# Patient Record
Sex: Female | Born: 1950 | Race: White | Hispanic: No | Marital: Single | Smoking: Former smoker
Health system: Southern US, Community
[De-identification: ages and names within clinical notes are randomized; demographics above are authoritative.]

## PROBLEM LIST (undated history)

## (undated) DIAGNOSIS — F419 Anxiety disorder, unspecified: Secondary | ICD-10-CM

## (undated) DIAGNOSIS — Z87442 Personal history of urinary calculi: Secondary | ICD-10-CM

## (undated) DIAGNOSIS — E54 Ascorbic acid deficiency: Secondary | ICD-10-CM

## (undated) DIAGNOSIS — M810 Age-related osteoporosis without current pathological fracture: Secondary | ICD-10-CM

## (undated) DIAGNOSIS — G4700 Insomnia, unspecified: Secondary | ICD-10-CM

## (undated) DIAGNOSIS — E559 Vitamin D deficiency, unspecified: Secondary | ICD-10-CM

## (undated) DIAGNOSIS — C801 Malignant (primary) neoplasm, unspecified: Secondary | ICD-10-CM

## (undated) DIAGNOSIS — I2510 Atherosclerotic heart disease of native coronary artery without angina pectoris: Secondary | ICD-10-CM

## (undated) DIAGNOSIS — I10 Essential (primary) hypertension: Secondary | ICD-10-CM

## (undated) DIAGNOSIS — E785 Hyperlipidemia, unspecified: Secondary | ICD-10-CM

## (undated) HISTORY — PX: BACK SURGERY: SHX140

## (undated) HISTORY — DX: Age-related osteoporosis without current pathological fracture: M81.0

## (undated) HISTORY — PX: WISDOM TOOTH EXTRACTION: SHX21

## (undated) HISTORY — PX: COLONOSCOPY: SHX174

## (undated) HISTORY — PX: LITHOTRIPSY: SUR834

## (undated) HISTORY — PX: PELVIC LAPAROSCOPY: SHX162

## (undated) HISTORY — DX: Atherosclerotic heart disease of native coronary artery without angina pectoris: I25.10

## (undated) HISTORY — DX: Hyperlipidemia, unspecified: E78.5

## (undated) HISTORY — DX: Insomnia, unspecified: G47.00

## (undated) HISTORY — DX: Anxiety disorder, unspecified: F41.9

## (undated) HISTORY — DX: Vitamin D deficiency, unspecified: E55.9

## (undated) HISTORY — DX: Ascorbic acid deficiency: E54

## (undated) HISTORY — PX: EYE SURGERY: SHX253

---

## 1998-09-27 ENCOUNTER — Other Ambulatory Visit: Admission: RE | Admit: 1998-09-27 | Discharge: 1998-09-27 | Payer: Self-pay | Admitting: *Deleted

## 1999-11-13 ENCOUNTER — Other Ambulatory Visit: Admission: RE | Admit: 1999-11-13 | Discharge: 1999-11-13 | Payer: Self-pay | Admitting: Obstetrics & Gynecology

## 2000-11-17 ENCOUNTER — Other Ambulatory Visit: Admission: RE | Admit: 2000-11-17 | Discharge: 2000-11-17 | Payer: Self-pay | Admitting: Obstetrics & Gynecology

## 2002-05-04 ENCOUNTER — Other Ambulatory Visit: Admission: RE | Admit: 2002-05-04 | Discharge: 2002-05-04 | Payer: Self-pay | Admitting: Obstetrics & Gynecology

## 2009-12-25 ENCOUNTER — Encounter: Admission: RE | Admit: 2009-12-25 | Discharge: 2009-12-25 | Payer: Self-pay | Admitting: Internal Medicine

## 2010-01-07 ENCOUNTER — Ambulatory Visit (HOSPITAL_COMMUNITY): Admission: RE | Admit: 2010-01-07 | Discharge: 2010-01-07 | Payer: Self-pay | Admitting: Urology

## 2010-04-08 ENCOUNTER — Ambulatory Visit (HOSPITAL_COMMUNITY): Admission: RE | Admit: 2010-04-08 | Discharge: 2010-04-08 | Payer: Self-pay | Admitting: Urology

## 2010-05-26 ENCOUNTER — Encounter: Admission: RE | Admit: 2010-05-26 | Discharge: 2010-05-26 | Payer: Self-pay | Admitting: Obstetrics & Gynecology

## 2010-06-14 ENCOUNTER — Ambulatory Visit (HOSPITAL_COMMUNITY)
Admission: RE | Admit: 2010-06-14 | Discharge: 2010-06-14 | Payer: Self-pay | Source: Home / Self Care | Admitting: Obstetrics & Gynecology

## 2010-07-09 ENCOUNTER — Ambulatory Visit (HOSPITAL_COMMUNITY)
Admission: RE | Admit: 2010-07-09 | Discharge: 2010-07-09 | Payer: Self-pay | Source: Home / Self Care | Attending: Urology | Admitting: Urology

## 2010-08-27 ENCOUNTER — Other Ambulatory Visit: Payer: Self-pay | Admitting: Obstetrics & Gynecology

## 2010-09-23 LAB — SURGICAL PCR SCREEN
MRSA, PCR: NEGATIVE
Staphylococcus aureus: NEGATIVE

## 2010-09-23 LAB — CBC
Hemoglobin: 11.3 g/dL — ABNORMAL LOW (ref 12.0–15.0)
MCHC: 33 g/dL (ref 30.0–36.0)
RDW: 13 % (ref 11.5–15.5)
WBC: 5.4 10*3/uL (ref 4.0–10.5)

## 2010-09-24 LAB — COMPREHENSIVE METABOLIC PANEL
ALT: 31 U/L (ref 0–35)
BUN: 13 mg/dL (ref 6–23)
CO2: 26 mEq/L (ref 19–32)
Calcium: 9.2 mg/dL (ref 8.4–10.5)
Creatinine, Ser: 0.76 mg/dL (ref 0.4–1.2)
GFR calc non Af Amer: >60 mL/min (ref >60)
Glucose, Bld: 82 mg/dL (ref 70–99)

## 2010-09-24 LAB — CBC
HCT: 33.9 % — ABNORMAL LOW (ref 36.0–46.0)
Hemoglobin: 11.5 g/dL — ABNORMAL LOW (ref 12.0–15.0)
MCH: 29.8 pg (ref 26.0–34.0)
MCHC: 34 g/dL (ref 30.0–36.0)
MCV: 87.6 fL (ref 78.0–100.0)

## 2010-09-24 LAB — TYPE AND SCREEN
ABO/RH(D): O POS
Antibody Screen: NEGATIVE

## 2010-09-26 LAB — SURGICAL PCR SCREEN: Staphylococcus aureus: NEGATIVE

## 2010-11-06 ENCOUNTER — Other Ambulatory Visit (HOSPITAL_COMMUNITY): Payer: Self-pay | Admitting: Urology

## 2010-11-06 DIAGNOSIS — N1330 Unspecified hydronephrosis: Secondary | ICD-10-CM

## 2010-11-26 ENCOUNTER — Encounter (HOSPITAL_COMMUNITY)
Admission: RE | Admit: 2010-11-26 | Discharge: 2010-11-26 | Disposition: A | Discharge status: Home / Self Care | Payer: BC Managed Care – PPO | Source: Ambulatory Visit | Attending: Urology | Admitting: Urology

## 2010-11-26 DIAGNOSIS — N1330 Unspecified hydronephrosis: Secondary | ICD-10-CM | POA: Insufficient documentation

## 2010-11-26 DIAGNOSIS — N200 Calculus of kidney: Secondary | ICD-10-CM | POA: Insufficient documentation

## 2010-11-26 MED ORDER — FUROSEMIDE 10 MG/ML IJ SOLN: 30.0000 mg | Freq: Once | INTRAMUSCULAR | Status: DC

## 2010-11-26 MED ORDER — TECHNETIUM TC 99M MERTIATIDE
15.1000 | Freq: Once | INTRAVENOUS | Status: AC | PRN
  Administered 2010-11-26: 15.1 via INTRAVENOUS

## 2011-11-24 IMAGING — CR DG ABDOMEN 1V
1 series · 1 of 1 positions shown · non-contrast
Comparison: CT 12/25/2009.

CLINICAL DATA: 59-year-old female pre lithotripsy.  Left urologic
stone.

ABDOMEN - 1 VIEW

[t abdomen supine]
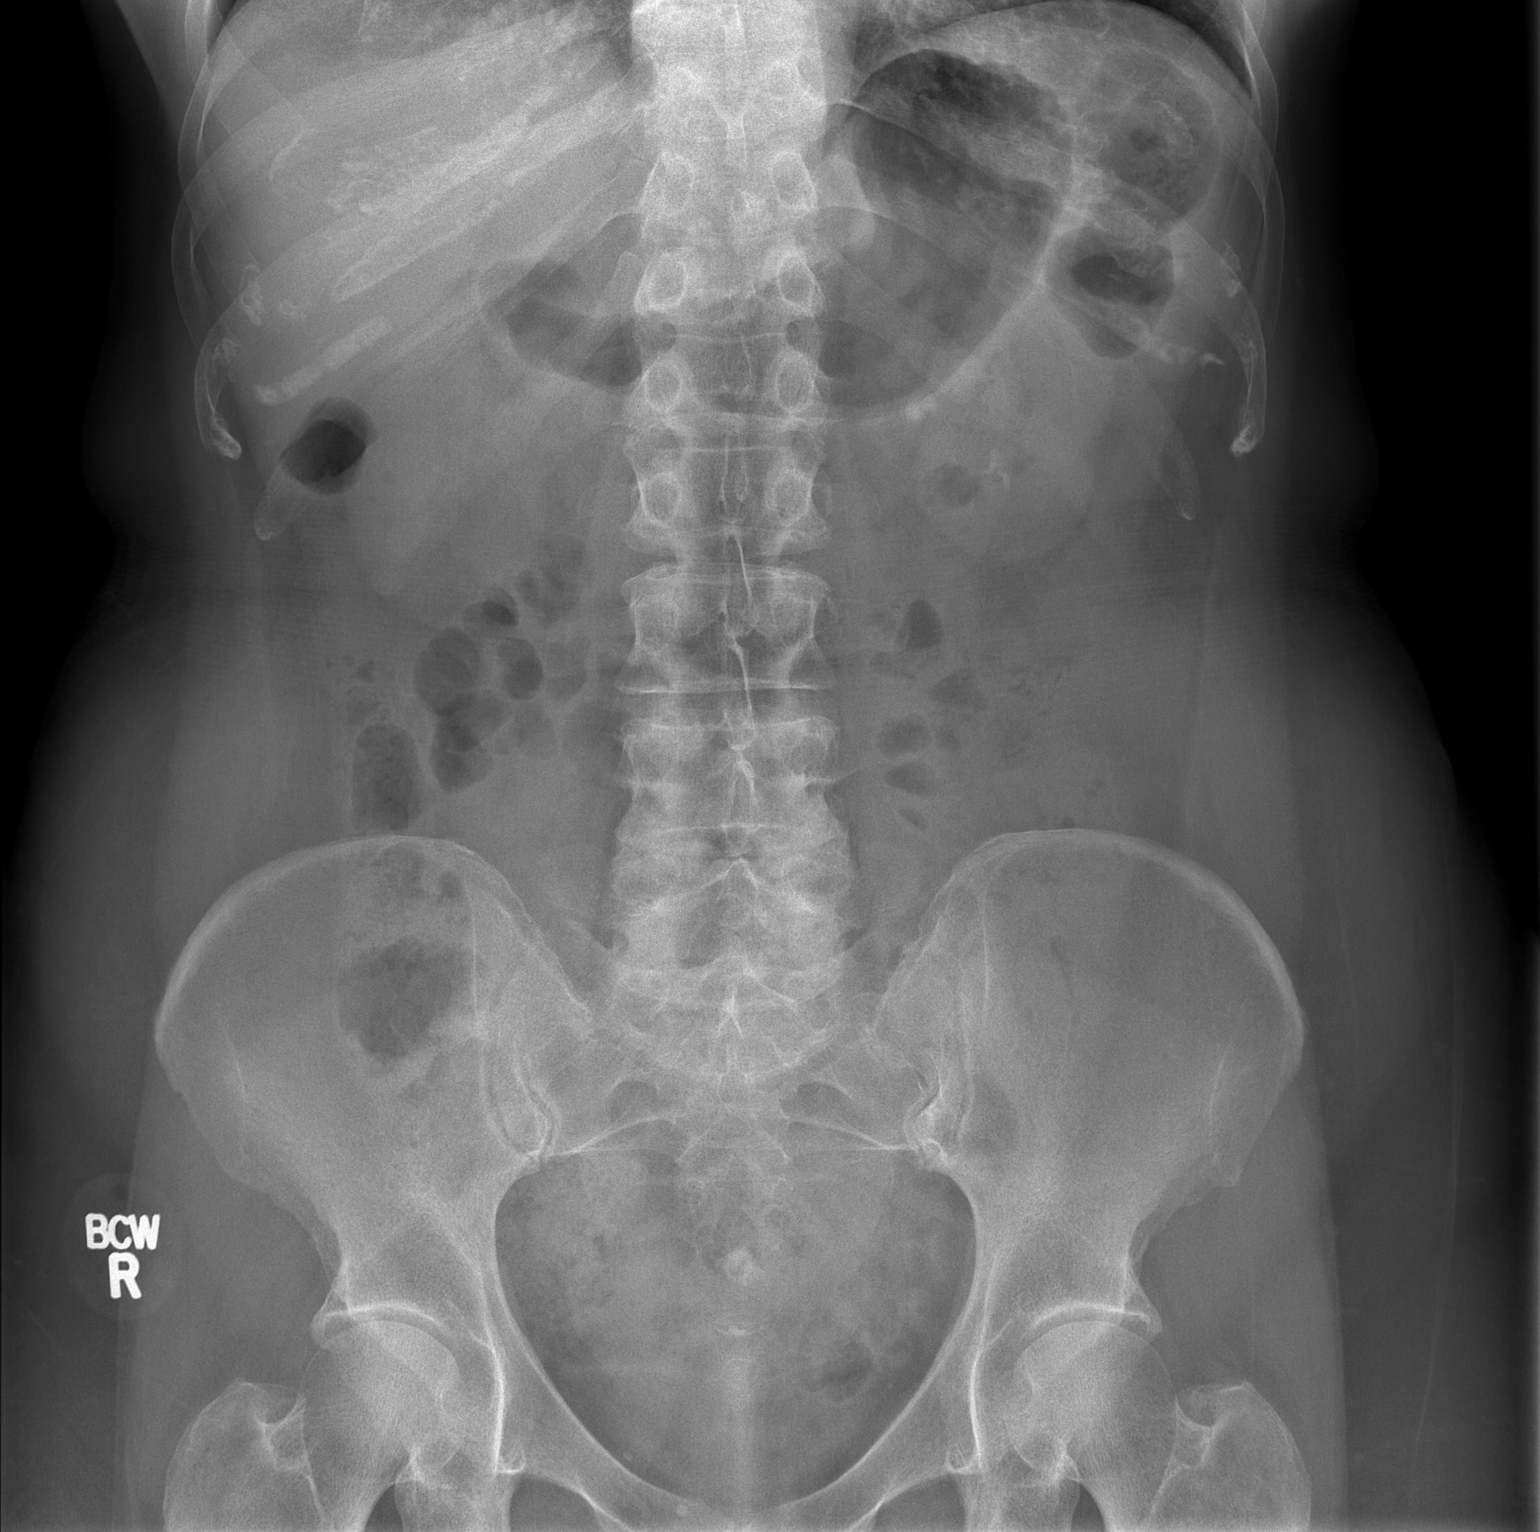

[1 of 1 positions shown; findings below may reference images not displayed]

FINDINGS: Nephrolithiasis re-identified with an 8 x 5 mm left renal
pelvis calculus and multiple curvilinear left lower pole calculi.
No definite left ureteral calculus. Nonobstructed bowel gas
pattern. Other abdominal visceral contours are within normal
limits.  No right nephrolithiasis. No acute osseous abnormality
identified.
IMPRESSION: Left nephrolithiasis appears stable including an 8 x 4 mm calculus
at the left renal pelvis.

## 2013-06-28 ENCOUNTER — Other Ambulatory Visit: Payer: Self-pay | Admitting: Internal Medicine

## 2013-06-28 DIAGNOSIS — R109 Unspecified abdominal pain: Secondary | ICD-10-CM

## 2013-07-01 ENCOUNTER — Ambulatory Visit
Admission: RE | Admit: 2013-07-01 | Discharge: 2013-07-01 | Disposition: A | Discharge status: Home / Self Care | Payer: BC Managed Care – PPO | Source: Ambulatory Visit | Attending: Internal Medicine | Admitting: Internal Medicine

## 2013-07-01 DIAGNOSIS — R109 Unspecified abdominal pain: Secondary | ICD-10-CM

## 2013-12-29 ENCOUNTER — Other Ambulatory Visit: Payer: Self-pay | Admitting: Gastroenterology

## 2013-12-29 DIAGNOSIS — R109 Unspecified abdominal pain: Secondary | ICD-10-CM

## 2014-01-16 ENCOUNTER — Encounter (HOSPITAL_COMMUNITY): Payer: Self-pay

## 2014-01-16 ENCOUNTER — Encounter (HOSPITAL_COMMUNITY)
Admission: RE | Admit: 2014-01-16 | Discharge: 2014-01-16 | Disposition: A | Discharge status: Home / Self Care | Payer: BC Managed Care – PPO | Source: Ambulatory Visit | Attending: Gastroenterology | Admitting: Gastroenterology

## 2014-01-16 ENCOUNTER — Ambulatory Visit (HOSPITAL_COMMUNITY)
Admission: RE | Admit: 2014-01-16 | Discharge: 2014-01-16 | Disposition: A | Discharge status: Home / Self Care | Payer: BC Managed Care – PPO | Source: Ambulatory Visit | Attending: Gastroenterology | Admitting: Gastroenterology

## 2014-01-16 DIAGNOSIS — R109 Unspecified abdominal pain: Secondary | ICD-10-CM | POA: Insufficient documentation

## 2014-01-16 DIAGNOSIS — N281 Cyst of kidney, acquired: Secondary | ICD-10-CM | POA: Insufficient documentation

## 2014-01-16 DIAGNOSIS — K7689 Other specified diseases of liver: Secondary | ICD-10-CM | POA: Insufficient documentation

## 2014-01-16 MED ORDER — TECHNETIUM TC 99M MEBROFENIN IV KIT
5.5000 | PACK | Freq: Once | INTRAVENOUS | Status: AC | PRN
  Administered 2014-01-16: 5.5 via INTRAVENOUS

## 2017-01-28 ENCOUNTER — Other Ambulatory Visit: Payer: Self-pay | Admitting: Internal Medicine

## 2017-01-28 DIAGNOSIS — M545 Low back pain, unspecified: Secondary | ICD-10-CM

## 2017-01-28 DIAGNOSIS — M5136 Other intervertebral disc degeneration, lumbar region: Secondary | ICD-10-CM

## 2017-02-16 ENCOUNTER — Ambulatory Visit
Admission: RE | Admit: 2017-02-16 | Discharge: 2017-02-16 | Disposition: A | Discharge status: Home / Self Care | Payer: Medicare Other | Source: Ambulatory Visit | Attending: Internal Medicine | Admitting: Internal Medicine

## 2017-02-16 DIAGNOSIS — M545 Low back pain, unspecified: Secondary | ICD-10-CM

## 2017-02-16 DIAGNOSIS — M5136 Other intervertebral disc degeneration, lumbar region: Secondary | ICD-10-CM

## 2017-10-21 ENCOUNTER — Other Ambulatory Visit: Payer: Self-pay | Admitting: Internal Medicine

## 2017-10-21 DIAGNOSIS — E785 Hyperlipidemia, unspecified: Secondary | ICD-10-CM

## 2017-10-21 DIAGNOSIS — I10 Essential (primary) hypertension: Secondary | ICD-10-CM

## 2017-11-12 ENCOUNTER — Ambulatory Visit
Admission: RE | Admit: 2017-11-12 | Discharge: 2017-11-12 | Disposition: A | Discharge status: Home / Self Care | Payer: Medicare Other | Source: Ambulatory Visit | Attending: Internal Medicine | Admitting: Internal Medicine

## 2017-11-12 DIAGNOSIS — I10 Essential (primary) hypertension: Secondary | ICD-10-CM

## 2017-11-12 DIAGNOSIS — E785 Hyperlipidemia, unspecified: Secondary | ICD-10-CM

## 2019-08-11 ENCOUNTER — Ambulatory Visit: Payer: No Typology Code available for payment source

## 2019-08-19 ENCOUNTER — Ambulatory Visit: Payer: No Typology Code available for payment source

## 2019-09-01 ENCOUNTER — Ambulatory Visit: Payer: No Typology Code available for payment source

## 2020-06-22 ENCOUNTER — Other Ambulatory Visit: Payer: Self-pay | Admitting: Internal Medicine

## 2020-06-22 DIAGNOSIS — M810 Age-related osteoporosis without current pathological fracture: Secondary | ICD-10-CM

## 2020-09-12 ENCOUNTER — Ambulatory Visit: Payer: Self-pay | Admitting: Obstetrics & Gynecology

## 2020-09-18 ENCOUNTER — Ambulatory Visit: Payer: Self-pay | Admitting: Obstetrics & Gynecology

## 2020-10-01 ENCOUNTER — Other Ambulatory Visit: Payer: Self-pay

## 2020-10-01 ENCOUNTER — Ambulatory Visit
Admission: RE | Admit: 2020-10-01 | Discharge: 2020-10-01 | Disposition: A | Discharge status: Home / Self Care | Payer: Medicare Other | Source: Ambulatory Visit | Attending: Internal Medicine | Admitting: Internal Medicine

## 2020-10-01 DIAGNOSIS — M810 Age-related osteoporosis without current pathological fracture: Secondary | ICD-10-CM

## 2020-10-04 ENCOUNTER — Encounter: Payer: Self-pay | Admitting: Obstetrics & Gynecology

## 2020-10-04 ENCOUNTER — Other Ambulatory Visit: Payer: Self-pay

## 2020-10-04 ENCOUNTER — Ambulatory Visit: Payer: Medicare Other | Admitting: Obstetrics & Gynecology

## 2020-10-04 ENCOUNTER — Other Ambulatory Visit: Payer: Self-pay | Admitting: Internal Medicine

## 2020-10-04 VITALS — BP 150/90 | Ht 63.0 in | Wt 144.0 lb

## 2020-10-04 DIAGNOSIS — M810 Age-related osteoporosis without current pathological fracture: Secondary | ICD-10-CM

## 2020-10-04 DIAGNOSIS — Z01419 Encounter for gynecological examination (general) (routine) without abnormal findings: Secondary | ICD-10-CM

## 2020-10-04 DIAGNOSIS — Z780 Asymptomatic menopausal state: Secondary | ICD-10-CM

## 2020-10-04 NOTE — Progress Notes (Signed)
Jodi Dawson Feb 17, 1951 [id number]   History:    70 y.o. G0  RP:  New (>3 years) patient presenting for annual gyn exam   HPI: Postmenopause, well on no HRT.  No PMB.  H/O Unilateral Oophorectomy.  No pelvic pain.  Urine/BMs normal.  Breasts normal.  Screening mammo 08/2020 Negative.  [facility] 2016.  BD 09/2020 T-Score -2.8 at the Left Total Femur, managed by Dr Joylene Draft.  Health labs with Dr Joylene Draft.  BMI 25.51.  Good fitness.    Past medical history,surgical history, family history and social history were all reviewed and documented in the EPIC chart.  Gynecologic History No LMP recorded. Patient is postmenopausal.  Obstetric History OB History  Gravida Para Term Preterm AB Living  0 0 0 0 0 0  SAB IAB Ectopic Multiple Live Births  0 0 0 0 0     ROS: A ROS was performed and pertinent positives and negatives are included in the history.  GENERAL: No fevers or chills. HEENT: No change in vision, no earache, sore throat or sinus congestion. NECK: No pain or stiffness. CARDIOVASCULAR: No chest pain or pressure. No palpitations. PULMONARY: No shortness of breath, cough or wheeze. GASTROINTESTINAL: No abdominal pain, nausea, vomiting or diarrhea, melena or bright red blood per rectum. GENITOURINARY: No urinary frequency, urgency, hesitancy or dysuria. MUSCULOSKELETAL: No joint or muscle pain, no back pain, no recent trauma. DERMATOLOGIC: No rash, no itching, no lesions. ENDOCRINE: No polyuria, polydipsia, no heat or cold intolerance. No recent change in weight. HEMATOLOGICAL: No anemia or easy bruising or bleeding. NEUROLOGIC: No headache, seizures, numbness, tingling or weakness. PSYCHIATRIC: No depression, no loss of interest in normal activity or change in sleep pattern.     Exam:   BP (!) 150/90   Ht 5\' 3"  (1.6 m)   Wt 144 lb (65.3 kg)   BMI 25.51 kg/m   Body mass index is 25.51 kg/m.  General appearance : Well developed well nourished female. No acute distress HEENT: Eyes: no  retinal hemorrhage or exudates,  Neck supple, trachea midline, no carotid bruits, no thyroidmegaly Lungs: Clear to auscultation, no rhonchi or wheezes, or rib retractions  Heart: Regular rate and rhythm, no murmurs or gallops Breast:Examined in sitting and supine position were symmetrical in appearance, no palpable masses or tenderness,  no skin retraction, no nipple inversion, no nipple discharge, no skin discoloration, no axillary or supraclavicular lymphadenopathy Abdomen: no palpable masses or tenderness, no rebound or guarding Extremities: no edema or skin discoloration or tenderness  Pelvic: Vulva: Normal             Vagina: No gross lesions or discharge  Cervix: No gross lesions or discharge.  Pap reflex done.  Uterus  AV, normal size, shape and consistency, non-tender and mobile  Adnexa  Without masses or tenderness  Anus: Normal   Assessment/Plan:  70 y.o. female for annual exam   1. Encounter for routine gynecological examination with Papanicolaou smear of cervix Normal gynecologic exam in menopause.  Pap reflex done.  Breast exam normal.  Screening mammogram February 2022 was negative.  Colonoscopy 2016.  Health labs with Dr. Joylene Draft.  Body mass index 25.51.  Continue with fitness and healthy nutrition.  2. Postmenopause Well on no hormone replacement therapy.  No postmenopausal bleeding.  3. Age-related osteoporosis without current pathological fracture Osteoporosis with a T score of -2.8 at the left total femur on bone density March 2022.  Decided not to start on bone medication.  Taking  a bone supplementation including vitamin D and calcium.  Weightbearing physical activities to continue.  Dr. Milagros Loll managing.  Other orders - telmisartan (MICARDIS) 40 MG tablet; Take 40 mg by mouth daily. - simvastatin (ZOCOR) 10 MG tablet; Take 10 mg by mouth daily. - cholecalciferol (VITAMIN D3) 25 MCG (1000 UNIT) tablet; Take 1,000 Units by mouth daily.  Princess Bruins MD, 11:36 AM  10/04/2020

## 2020-10-08 LAB — PAP IG W/ RFLX HPV ASCU

## 2020-10-10 ENCOUNTER — Encounter: Payer: Self-pay | Admitting: Obstetrics & Gynecology

## 2020-11-06 ENCOUNTER — Encounter: Payer: Self-pay | Admitting: Obstetrics & Gynecology

## 2021-10-02 ENCOUNTER — Other Ambulatory Visit: Payer: Self-pay | Admitting: Urology

## 2021-10-07 NOTE — Progress Notes (Signed)
Pre op phone call completed.  Pt aware she is NPO after midnight.  Pt to arrive at 645.  ?

## 2021-10-07 NOTE — Progress Notes (Signed)
Left message to call us back

## 2021-10-08 NOTE — H&P (Signed)
I have kidney stones.  ?HPI: Jodi Dawson is a 71 year-old female established patient who is here for renal calculi. ? ?The problem is on the left side. This is not her first kidney stone. She has had 4 stones prior to getting this one.  ? ?She has had eswl, ureteral stent, and ureteroscopy for treatment of her stones in the past.  ? ?10/02/21: Jodi Dawson returns today for her history of stones. Last Friday she had some left flank pain and for a week prior she had some LLQ discomfort and bloating with alternating diarrhea and constipation. The pain on Friday got progressively worse and lasted for 5 hours and took tramadol. She did well from Saturday until today. It is back today though. He has no hematuria or inceased voiding symptoms. She had nausea all week as well that was mild. She is waiting to schedule with [facility]. KUB today shows that an 14x76m stone that was in the LLP is now in the UPJ. There is a smaller, 564mLLP stone. She had ESWL and ureteroscopy with stenting x 2 in 2011 for a stone and a left mid ureteral stricture.  ? ?04/03/21: [name] today in f/u for her history of stones. she has had some severe pain for about an hour on the right last week. She had N/V with it as well. She had no hematuria. flank pain or hematuria. KUB today shows slight growth in the LLP stones. I don't see obvious right ureteral or renal stones. She has no associated signs or symptoms. Her prior stones were calcium oxalate. She had multiple procedures for a CaOx stone in 2011. She had ESWL and then ureteroscopy x 2 and require dilation of a midureteral stricture and stenting and didn't like the stent.  ? ?She had a prior 24hr urine. She had a urine volume of 81048mith a low pH and low magnesium. She has increased her fluid intake but only to about 50oz daily.  ? ?  ?ALLERGIES: No Allergies ?  ? ?MEDICATIONS: Simvastatin  ?Telmisartan  ?Vitamin D3 50 mcg (2,000 unit) capsule  ?  ? ?GU PSH: Cysto Dilate Ureteral Stricture -  2011, 2011 ?Cysto Uretero Lithotripsy - 2011 ?Cystoscopy Insert Stent - 2011, 2011, 2011 ?Cystoscopy Ureteroscopy - 2011 ?ESWL - 2011 ?Ureteroscopic stone removal - 2011 ? ?  ?   ?PSH Notes: Cystourethroscopy With Treatment Of Ureteral Stricture, Cystoscopy With Insertion Of Ureteral Stent Left, Cystoscopy With Ureteroscopy Left, Cystoscopy With Ureteroscopy With Removal Of Calculus, Cystourethroscopy With Treatment Of Ureteral Stricture, Cystoscopy With Insertion Of Ureteral Stent Left, Cystoscopy With Insertion Of Ureteral Stent Left, Cystoscopy With Ureteroscopy With Lithotripsy, Lithotripsy, Back Surgery  ? ?NON-GU PSH: No Non-GU PSH   ? ?GU PMH: Renal calculus, She has slight further enlargement of the left lower pole stone and had right flank pain about 2 weeks ago but that has resolved, the urine is clear and I don't see an obvious stone on KUB. If the pain recurs, I will get a CT, otherwise she will return in 6 months with a KUB. She has had a bad experience with ESWL and ureteroscopy and doesn't want a stent, so if the LLP stone needs treatment, we will need to consider a PCNL. I also discussed adding a thiazide but she is not interested at this time. - 04/03/2021, She has at most minimal growth in the LLP stones. I will have her return in a year with a KUB. , - 11/28/2019 (Stable), She will continue  to work on hydration and will return in a year with a KUB. , - 2020, Nephrolithiasis, - 2015 ?Ureteral calculus, I believe she recently passed a left ureteral stone. - 2019, Ureteral stone, - 2015, Ureteral Stone, - 2014 ?Ureteral stricture, Ureteral stricture - 2015 ?Gross hematuria, Gross hematuria - 2014 ?Hydronephrosis Unspec, Hydronephrosis - 2014 ?Urinary incontinence, Unspec, Urinary incontinence - 2014 ?Urinary Tract Inf, Unspec site, Pyuria - 2014, Urinary tract infection, - 2014 ?  ? ?NON-GU PMH: Encounter for general adult medical examination without abnormal findings, Encounter for preventive health  examination - 2015 ?Arthritis ?Hypercholesterolemia ?Hypertension ?  ? ?FAMILY HISTORY: Alzheimer's Disease - Mother ?Diabetes - Brother ?Heart Disease - Father ?nephrolithiasis - Mother ?Stroke Syndrome - Brother  ? ?SOCIAL HISTORY: Marital Status: Divorced ?Current Smoking Status: Patient does not smoke anymore. Has not smoked since 02/12/1984. Smoked for 16 years. Smoked 1 pack per day.  ? ?Tobacco Use Assessment Completed: Used Tobacco in last 30 days? ?Drinks 1 drink per day.  ?Drinks 2 caffeinated drinks per day. ?  ?  Notes: Former smoker, Tobacco Use, Marital History - Divorced, Occupation:, Alcohol Use  ? ?REVIEW OF SYSTEMS:    ?GU Review Female:   Patient reports get up at night to urinate. Patient denies frequent urination, hard to postpone urination, burning /pain with urination, leakage of urine, stream starts and stops, trouble starting your stream, have to strain to urinate, and being pregnant.  ?Gastrointestinal (Upper):   Patient denies nausea, vomiting, and indigestion/ heartburn.  ?Gastrointestinal (Lower):   Patient denies diarrhea and constipation.  ?Constitutional:   Patient denies fever, night sweats, weight loss, and fatigue.  ?Skin:   Patient denies skin rash/ lesion and itching.  ?Eyes:   Patient denies blurred vision and double vision.  ?Ears/ Nose/ Throat:   Patient denies sore throat and sinus problems.  ?Hematologic/Lymphatic:   Patient denies swollen glands and easy bruising.  ?Cardiovascular:   Patient denies leg swelling and chest pains.  ?Respiratory:   Patient denies cough and shortness of breath.  ?Endocrine:   Patient denies excessive thirst.  ?Musculoskeletal:   Patient denies back pain and joint pain.  ?Neurological:   Patient denies headaches and dizziness.  ?Psychologic:   Patient denies depression and anxiety.  ? ?VITAL SIGNS:    ?  10/02/2021 10:44 AM  ?Weight 143 lb / 64.86 kg  ?Height 63.5 in / 161.29 cm  ?BP 190/90 mmHg  ?Heart Rate 82 /min  ?Temperature 98.9 F / 37.1 C   ?BMI 24.9 kg/m?  ? ?MULTI-SYSTEM PHYSICAL EXAMINATION:    ?Constitutional: Well-nourished. No physical deformities. Normally developed. Good grooming.  ?Respiratory: Normal breath sounds. No labored breathing, no use of accessory muscles.   ?Cardiovascular: Regular rate and rhythm. No murmur, no gallop.   ?Gastrointestinal: No mass, no tenderness, no rigidity, non obese abdomen.   ? ?  ?Complexity of Data:  ?Records Review:   Previous Doctor Records, Previous Patient Records  ?Urine Test Review:   Urinalysis  ?X-Ray Review: KUB: Reviewed Films. Discussed With Patient.  ?C.T. Stone Protocol: Reviewed Films.  ?  ? ?PROCEDURES:    ?     KUB - 53299  ?A single view of the abdomen is obtained. 14x19m LUPJ stone and 533mLLP stone. NO bone, gas or soft tissue abnormalities.   ?  ?  ?. Patient confirmed No Neulasta OnPro Device.  ?  ? ? ?     Urinalysis ?Dipstick Dipstick Cont'd  ?Color: Yellow Bilirubin: Neg mg/dL  ?  Appearance: Clear Ketones: Trace mg/dL  ?Specific Gravity: 1.020 Blood: Neg ery/uL  ?pH: <=5.0 Protein: Trace mg/dL  ?Glucose: Neg mg/dL Urobilinogen: 0.2 mg/dL  ?  Nitrites: Neg  ?  Leukocyte Esterase: Neg leu/uL  ? ? ?ASSESSMENT:  ?    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 She has mild pain. I discussed options for therapy including ESWL, ureteroscopy and PCNL. She didn't like the stent, but is getting married in 6 weeks and doesn't want to risk the PCNL. After reviewing the options, we will get her set up for ESWL with the understanding that if the stone doesn't fragment readily or the fragments won't pass, we will proceed with ureteroscopy sooner than later to try to get her clear before her wedding.  ? ? ?I reviewed the risks of ESWL including bleeding, infection, injury to the kidney or adjacent structures, failure to fragment the stone, need for ancillary  procedures, thrombotic events, cardiac arrhythmias and sedation complications.  ? ?I have reviewed the risks of ureteroscopy including bleeding, infection, ureteral injury, need for a stent or secondary procedur

## 2021-10-10 ENCOUNTER — Encounter (HOSPITAL_BASED_OUTPATIENT_CLINIC_OR_DEPARTMENT_OTHER)
Admission: RE | Disposition: A | Discharge status: Home / Self Care | Payer: Self-pay | Source: Ambulatory Visit | Attending: Urology

## 2021-10-10 ENCOUNTER — Ambulatory Visit (HOSPITAL_BASED_OUTPATIENT_CLINIC_OR_DEPARTMENT_OTHER)
Admission: RE | Admit: 2021-10-10 | Discharge: 2021-10-10 | Disposition: A | Discharge status: Home / Self Care | Payer: Medicare Other | Source: Ambulatory Visit | Attending: Urology | Admitting: Urology

## 2021-10-10 ENCOUNTER — Ambulatory Visit (HOSPITAL_COMMUNITY): Payer: Medicare Other

## 2021-10-10 ENCOUNTER — Encounter (HOSPITAL_BASED_OUTPATIENT_CLINIC_OR_DEPARTMENT_OTHER): Payer: Self-pay | Admitting: Urology

## 2021-10-10 ENCOUNTER — Other Ambulatory Visit: Payer: Self-pay

## 2021-10-10 DIAGNOSIS — Z87891 Personal history of nicotine dependence: Secondary | ICD-10-CM | POA: Diagnosis not present

## 2021-10-10 DIAGNOSIS — N200 Calculus of kidney: Secondary | ICD-10-CM | POA: Diagnosis present

## 2021-10-10 DIAGNOSIS — N202 Calculus of kidney with calculus of ureter: Secondary | ICD-10-CM | POA: Insufficient documentation

## 2021-10-10 HISTORY — PX: EXTRACORPOREAL SHOCK WAVE LITHOTRIPSY: SHX1557

## 2021-10-10 HISTORY — DX: Essential (primary) hypertension: I10

## 2021-10-10 SURGERY — LITHOTRIPSY, ESWL
Anesthesia: LOCAL | Laterality: Left

## 2021-10-10 MED ORDER — CIPROFLOXACIN HCL 500 MG PO TABS
500.0000 mg | ORAL_TABLET | ORAL | Status: AC
  Administered 2021-10-10: 500 mg via ORAL

## 2021-10-10 MED ORDER — DIPHENHYDRAMINE HCL 25 MG PO CAPS
ORAL_CAPSULE | ORAL | Status: AC
  Filled 2021-10-10: qty 1

## 2021-10-10 MED ORDER — DIAZEPAM 5 MG PO TABS: 10.0000 mg | ORAL_TABLET | ORAL | Status: DC

## 2021-10-10 MED ORDER — DIAZEPAM 5 MG PO TABS
ORAL_TABLET | ORAL | Status: AC
  Filled 2021-10-10: qty 2

## 2021-10-10 MED ORDER — SODIUM CHLORIDE 0.9 % IV SOLN: INTRAVENOUS | Status: DC

## 2021-10-10 MED ORDER — CIPROFLOXACIN HCL 500 MG PO TABS
ORAL_TABLET | ORAL | Status: AC
  Filled 2021-10-10: qty 1

## 2021-10-10 MED ORDER — ONDANSETRON HCL 4 MG PO TABS: 4.0000 mg | ORAL_TABLET | Freq: Three times a day (TID) | ORAL | 1 refills | Status: DC | PRN

## 2021-10-10 MED ORDER — DIAZEPAM 5 MG PO TABS
5.0000 mg | ORAL_TABLET | Freq: Once | ORAL | Status: AC
  Administered 2021-10-10: 5 mg via ORAL

## 2021-10-10 MED ORDER — DIPHENHYDRAMINE HCL 25 MG PO CAPS
25.0000 mg | ORAL_CAPSULE | ORAL | Status: AC
  Administered 2021-10-10: 25 mg via ORAL

## 2021-10-10 NOTE — Progress Notes (Signed)
Patients vital signs assessed while in phase II. Blood pressure reading of 187/83 at 1012. Cuff was switched to opposite arm and read 184/81. Patient asymptomatic and stated that she just felt drowsy from the anesthetic. Patient stated that she has white coat syndrome. Patients blood pressure was 171/85 baseline in pre-op. Patient takes Telmisartan for her blood pressure and the last dose was taken on 10/09/2021 at 1930. Patients blood pressure was rechecked at 1041 and read 177/68. Dr. Matilde Sprang notified by phone of blood pressure readings in phase II, pre-op, and patients last dose and time of medication. RN was asked if patient was asymptomatic and she stated yes and was advised to tell patient to take blood pressure medication once she arrived home. Patient was given verbal instructions from Dr. Matilde Sprang and she verbalized understanding.  ?

## 2021-10-10 NOTE — Interval H&P Note (Signed)
History and Physical Interval Note: ? ?10/10/2021 ?8:15 AM ? ?Jodi Dawson  has presented today for surgery, with the diagnosis of LEFT URETERAL PELVIC JUNCTION STONE.  The various methods of treatment have been discussed with the patient and family. After consideration of risks, benefits and other options for treatment, the patient has consented to  Procedure(s): ?LEFT EXTRACORPOREAL SHOCK WAVE LITHOTRIPSY (ESWL) (Left) as a surgical intervention.  The patient's history has been reviewed, patient examined, no change in status, stable for surgery.  I have reviewed the patient's chart and labs.  Questions were answered to the patient's satisfaction.   ? ? ?Bradford Cazier A Carnel Stegman ? ? ?

## 2021-10-10 NOTE — Discharge Instructions (Addendum)
I have reviewed discharge instructions in detail with the patient. They will follow-up with me or their physician as scheduled. My nurse will also be calling the patients as per protocol.   

## 2021-10-11 ENCOUNTER — Encounter (HOSPITAL_BASED_OUTPATIENT_CLINIC_OR_DEPARTMENT_OTHER): Payer: Self-pay | Admitting: Urology

## 2021-10-14 ENCOUNTER — Other Ambulatory Visit: Payer: Self-pay | Admitting: Urology

## 2021-10-16 NOTE — Patient Instructions (Addendum)
DUE TO COVID-19 ONLY ONE VISITOR  (aged 72 and older)  IS ALLOWED TO COME WITH YOU AND STAY IN THE WAITING ROOM ONLY DURING PRE OP AND PROCEDURE.   ?**NO VISITORS ARE ALLOWED IN THE SHORT STAY AREA OR RECOVERY ROOM!!** ? ?IF YOU WILL BE ADMITTED INTO THE HOSPITAL YOU ARE ALLOWED ONLY TWO SUPPORT PEOPLE DURING VISITATION HOURS ONLY (7 AM -8PM)   ?The support person(s) must pass our screening, gel in and out, and wear a mask at all times, including in the patient?s room. ?Patients must also wear a mask when staff or their support person are in the room. ?Visitors GUEST BADGE MUST BE WORN VISIBLY  ?One adult visitor may remain with you overnight and MUST be in the room by 8 P.M. ?  ? ? Your procedure is scheduled on: 10/25/21 ? ? Report to [facility] Main Entrance ? ?  Report to admitting at : 8:45 AM ? ? Call this number if you have problems the morning of surgery [phone] ? ? Do not eat food :After Midnight. ? ? After Midnight you may have the following liquids until : 8:00 AM DAY OF SURGERY ? ?Water ?Black Coffee (sugar ok, NO MILK/CREAM OR CREAMERS)  ?Tea (sugar ok, NO MILK/CREAM OR CREAMERS) regular and decaf                             ?Plain Jell-O (NO RED)                                           ?Fruit ices (not with fruit pulp, NO RED)                                     ?Popsicles (NO RED)                                                                  ?Juice: apple, WHITE grape, WHITE cranberry ?Sports drinks like Gatorade (NO RED) ?Clear broth(vegetable,chicken,beef)  ?  ?Oral Hygiene is also important to reduce your risk of infection.                                    ?Remember - BRUSH YOUR TEETH THE MORNING OF SURGERY WITH YOUR REGULAR TOOTHPASTE ? ? Do NOT smoke after Midnight ? ? Take these medicines the morning of surgery with A SIP OF WATER: N/A ? ?DO NOT TAKE ANY ORAL DIABETIC MEDICATIONS DAY OF YOUR SURGERY ? ?Bring CPAP mask and tubing day of surgery. ?                  ?            You may not have any metal on your body including hair pins, jewelry, and body piercing ? ?           Do not wear make-up, lotions, powders, perfumes/cologne, or deodorant ? ?Do not wear nail polish  including gel and S&S, artificial/acrylic nails, or any other type of covering on natural nails including finger and toenails. If you have artificial nails, gel coating, etc. that needs to be removed by a nail salon please have this removed prior to surgery or surgery may need to be canceled/ delayed if the surgeon/ anesthesia feels like they are unable to be safely monitored.  ? ?Do not shave  48 hours prior to surgery.  ? ? Do not bring valuables to the hospital. [facility] NOT ?            RESPONSIBLE   FOR VALUABLES. ? ? Contacts, dentures or bridgework may not be worn into surgery. ? ? Bring small overnight bag day of surgery. ?  ? Patients discharged on the day of surgery will not be allowed to drive home.  Someone NEEDS to stay with you for the first 24 hours after anesthesia. ? ? Special Instructions: Bring a copy of your healthcare power of attorney and living will documents         the day of surgery if you haven't scanned them before. ? ?            Please read over the following fact sheets you were given: IF [place] [phone] ? ?   [facility] - Preparing for Surgery ?Before surgery, you can play an important role.  Because skin is not sterile, your skin needs to be as free of germs as possible.  You can reduce the number of germs on your skin by washing with CHG (chlorahexidine gluconate) soap before surgery.  CHG is an antiseptic cleaner which kills germs and bonds with the skin to continue killing germs even after washing. ?Please DO NOT use if you have an allergy to CHG or antibacterial soaps.  If your skin becomes reddened/irritated stop using the CHG and inform your nurse when you arrive at Short Stay. ?Do not shave (including legs and  underarms) for at least 48 hours prior to the first CHG shower.  You may shave your face/neck. ?Please follow these instructions carefully: ? 1.  Shower with CHG Soap the night before surgery and the  morning of Surgery. ? 2.  If you choose to wash your hair, wash your hair first as usual with your  normal  shampoo. ? 3.  After you shampoo, rinse your hair and body thoroughly to remove the  shampoo.                           4.  Use CHG as you would any other liquid soap.  You can apply chg directly  to the skin and wash  ?                     Gently with a scrungie or clean washcloth. ? 5.  Apply the CHG Soap to your body ONLY FROM THE NECK DOWN.   Do not use on face/ open      ?                     Wound or open sores. Avoid contact with eyes, ears mouth and genitals (private parts).  ?                     Production manager,  Genitals (private parts) with your normal soap. ?  6.  Wash thoroughly, paying special attention to the area where your surgery  will be performed. ? 7.  Thoroughly rinse your body with warm water from the neck down. ? 8.  DO NOT shower/wash with your normal soap after using and rinsing off  the CHG Soap. ?               9.  Pat yourself dry with a clean towel. ?           10.  Wear clean pajamas. ?           11.  Place clean sheets on your bed the night of your first shower and do not  sleep with pets. ?Day of Surgery : ?Do not apply any lotions/deodorants the morning of surgery.  Please wear clean clothes to the hospital/surgery center. ? ?FAILURE TO FOLLOW THESE INSTRUCTIONS MAY RESULT IN THE CANCELLATION OF YOUR SURGERY ?PATIENT SIGNATURE_________________________________ ? ?NURSE SIGNATURE__________________________________ ? ?________________________________________________________________________  ?

## 2021-10-17 ENCOUNTER — Other Ambulatory Visit: Payer: Self-pay

## 2021-10-17 ENCOUNTER — Encounter (HOSPITAL_COMMUNITY)
Admission: RE | Admit: 2021-10-17 | Discharge: 2021-10-17 | Disposition: A | Discharge status: Home / Self Care | Payer: Medicare Other | Source: Ambulatory Visit | Attending: Urology | Admitting: Urology

## 2021-10-17 ENCOUNTER — Encounter (HOSPITAL_COMMUNITY): Payer: Self-pay

## 2021-10-17 VITALS — BP 154/78 | HR 79 | Temp 98.0°F | Resp 14 | Ht 63.0 in | Wt 141.1 lb

## 2021-10-17 DIAGNOSIS — Z01818 Encounter for other preprocedural examination: Secondary | ICD-10-CM | POA: Diagnosis present

## 2021-10-17 DIAGNOSIS — I2510 Atherosclerotic heart disease of native coronary artery without angina pectoris: Secondary | ICD-10-CM | POA: Diagnosis not present

## 2021-10-17 HISTORY — DX: Malignant (primary) neoplasm, unspecified: C80.1

## 2021-10-17 HISTORY — DX: Personal history of urinary calculi: Z87.442

## 2021-10-17 LAB — CBC
HCT: 39.2 % (ref 36.0–46.0)
Hemoglobin: 12.8 g/dL (ref 12.0–15.0)
MCH: 29.4 pg (ref 26.0–34.0)
MCHC: 32.7 g/dL (ref 30.0–36.0)
MCV: 90.1 fL (ref 80.0–100.0)
Platelets: 165 10*3/uL (ref 150–400)
RBC: 4.35 MIL/uL (ref 3.87–5.11)
RDW: 12.4 % (ref 11.5–15.5)
WBC: 6.9 10*3/uL (ref 4.0–10.5)
nRBC: 0 % (ref 0.0–0.2)

## 2021-10-17 LAB — BASIC METABOLIC PANEL
Anion gap: 4 — ABNORMAL LOW (ref 5–15)
BUN: 25 mg/dL — ABNORMAL HIGH (ref 8–23)
CO2: 29 mmol/L (ref 22–32)
Calcium: 9.3 mg/dL (ref 8.9–10.3)
Chloride: 107 mmol/L (ref 98–111)
Creatinine, Ser: 0.74 mg/dL (ref 0.44–1.00)
GFR, Estimated: >60 mL/min (ref >60)
Glucose, Bld: 95 mg/dL (ref 70–99)
Potassium: 4.2 mmol/L (ref 3.5–5.1)
Sodium: 140 mmol/L (ref 135–145)

## 2021-10-17 NOTE — Progress Notes (Addendum)
For Short Stay: ?[facility] [place] appointment date: ?Date of COVID positive in last 90 days: ?COVID Vaccine: Pfizer x 4 ?Bowel Prep reminder: ? ? ?For Anesthesia: ?PCP - Dr. Crist Infante. ?Cardiologist -  ? ?Chest x-ray -  ?EKG -  ?Stress Test -  ?ECHO -  ?Cardiac Cath -  ?Pacemaker/ICD device last checked: ?Pacemaker orders received: ?Device Rep notified: ? ?Spinal Cord Stimulator: ? ?Sleep Study -  ?CPAP -  ? ?Fasting Blood Sugar -  ?Checks Blood Sugar _____ times a day ?Date and result of last Hgb A1c- ? ?Blood Thinner Instructions: ?Aspirin Instructions: ?Last Dose: ? ?Activity level: Can go up a flight of stairs and activities of daily living without stopping and without chest pain and/or shortness of breath ?  Able to exercise without chest pain and/or shortness of breath ?  Unable to go up a flight of stairs without chest pain and/or shortness of breath ?   ? ?Anesthesia review:  ? ?Patient denies shortness of breath, fever, cough and chest pain at PAT appointment ? ? ?Patient verbalized understanding of instructions that were given to them at the PAT appointment. Patient was also instructed that they will need to review over the PAT instructions again at home before surgery.  ?

## 2021-10-23 ENCOUNTER — Encounter: Payer: Self-pay | Admitting: Internal Medicine

## 2021-10-24 NOTE — H&P (Signed)
I have kidney stones.  ?HPI: Jodi Dawson is a 71 year-old female established patient who is here for renal calculi. ? ?10/21/2021: She underwent ESWL to treat a large left UPJ stone on 10/10/2021. Now scheduled for definitive left ureteroscopy later this week. She is only passed a couple of very small sand-like stone fragments. She has had minimal pain/discomfort post procedure. Denies new or worsening lower urinary tract symptoms including persistent dysuria or gross hematuria. No interval fevers or chills, nausea/vomiting. UA today is clear but KUB shows a collection of fragmented stone material remaining in the left lower pole. KUB performed last week showed fragmented stone material in the left lower pole as well as a Steinstrasse of small stone fragments along one of the transverse process of the lumbar vertebrae on the left side.  ? ?10/02/21: Jodi Dawson returns today for her history of stones. Last Friday she had some left flank pain and for a week prior she had some LLQ discomfort and bloating with alternating diarrhea and constipation. The pain on Friday got progressively worse and lasted for 5 hours and took tramadol. She did well from Saturday until today. It is back today though. He has no hematuria or inceased voiding symptoms. She had nausea all week as well that was mild. She is waiting to schedule with  GI. KUB today shows that an 14x49m stone that was in the LLP is now in the UPJ. There is a smaller, 53mLLP stone. She had ESWL and ureteroscopy with stenting x 2 in 2011 for a stone and a left mid ureteral stricture.  ? ?04/03/21: Jodi Dawson today in f/u for her history of stones. she has had some severe pain for about an hour on the right last week. She had N/V with it as well. She had no hematuria. flank pain or hematuria. KUB today shows slight growth in the LLP stones. I don't see obvious right ureteral or renal stones. She has no associated signs or symptoms. Her prior stones were calcium  oxalate. She had multiple procedures for a CaOx stone in 2011. She had ESWL and then ureteroscopy x 2 and require dilation of a midureteral stricture and stenting and didn't like the stent.  ? ?She had a prior 24hr urine. She had a urine volume of 81031mith a low pH and low magnesium. She has increased her fluid intake but only to about 50oz daily.  ? ?The problem is on the left side. This is not her first kidney stone. She has had 4 stones prior to getting this one.  ? ?She has had eswl, ureteral stent, and ureteroscopy for treatment of her stones in the past.  ? ?  ?ALLERGIES: No Allergies ?  ? ?MEDICATIONS: Simvastatin  ?Telmisartan  ?  ? ?GU PSH: Cysto Dilate Ureteral Stricture - 2011, 2011 ?Cysto Uretero Lithotripsy - 2011 ?Cystoscopy Insert Stent - 2011, 2011, 2011 ?Cystoscopy Ureteroscopy - 2011 ?ESWL - 10/10/2021, 2011 ?Ureteroscopic stone removal - 2011 ? ?  ?   ?PSH Notes: Cystourethroscopy With Treatment Of Ureteral Stricture, Cystoscopy With Insertion Of Ureteral Stent Left, Cystoscopy With Ureteroscopy Left, Cystoscopy With Ureteroscopy With Removal Of Calculus, Cystourethroscopy With Treatment Of Ureteral Stricture, Cystoscopy With Insertion Of Ureteral Stent Left, Cystoscopy With Insertion Of Ureteral Stent Left, Cystoscopy With Ureteroscopy With Lithotripsy, Lithotripsy, Back Surgery  ? ?NON-GU PSH: None  ? ?GU PMH: Gross hematuria - 10/14/2021, Gross hematuria, - 2014 ?Renal and ureteral calculus, The larger 14x9 mm LLP stone is now at the UPJ  and there is a 30m LLP stone. She has mild pain. I discussed options for therapy including ESWL, ureteroscopy and PCNL. She didn't like the stent, but is getting married in 6 weeks and doesn't want to risk the PCNL. After reviewing the options, we will get her set up for ESWL with the understanding that if the stone doesn't fragment readily or the fragments won't pass, we will proceed with ureteroscopy sooner than later to try to get her clear before her  wedding. I reviewed the risks of ESWL including bleeding, infection, injury to the kidney or adjacent structures, failure to fragment the stone, need for ancillary procedures, thrombotic events, cardiac arrhythmias and sedation complications. I have reviewed the risks of ureteroscopy including bleeding, infection, ureteral injury, need for a stent or secondary procedures, thrombotic events and anesthetic complications. - 10/02/2021 ?Renal calculus, She has slight further enlargement of the left lower pole stone and had right flank pain about 2 weeks ago but that has resolved, the urine is clear and I don't see an obvious stone on KUB. If the pain recurs, I will get a CT, otherwise she will return in 6 months with a KUB. She has had a bad experience with ESWL and ureteroscopy and doesn't want a stent, so if the LLP stone needs treatment, we will need to consider a PCNL. I also discussed adding a thiazide but she is not interested at this time. - 04/03/2021, She has at most minimal growth in the LLP stones. I will have her return in a year with a KUB. , - 11/28/2019 (Stable), She will continue to work on hydration and will return in a year with a KUB. , - 2020, Nephrolithiasis, - 2015 ?Ureteral calculus, I believe she recently passed a left ureteral stone. - 2019, Ureteral stone, - 2015, Ureteral Stone, - 2014 ?Ureteral stricture, Ureteral stricture - 2015 ?Hydronephrosis Unspec, Hydronephrosis - 2014 ?Urinary incontinence, Unspec, Urinary incontinence - 2014 ?Urinary Tract Inf, Unspec site, Pyuria - 2014, Urinary tract infection, - 2014 ?  ? ?NON-GU PMH: Encounter for general adult medical examination without abnormal findings, Encounter for preventive health examination - 2015 ?Arthritis ?Hypercholesterolemia ?Hypertension ?  ? ?FAMILY HISTORY: Alzheimer's Disease - Mother ?Diabetes - Brother ?Heart Disease - Father ?nephrolithiasis - Mother ?Stroke Syndrome - Brother  ? ?SOCIAL HISTORY: Marital Status:  Divorced ?Current Smoking Status: Patient does not smoke anymore. Has not smoked since 02/12/1984. Smoked for 16 years. Smoked 1 pack per day.  ? ?Tobacco Use Assessment Completed: Used Tobacco in last 30 days? ?Drinks 1 drink per day.  ?Drinks 2 caffeinated drinks per day. ?  ?  Notes: Former smoker, Tobacco Use, Marital History - Divorced, Occupation:, Alcohol Use  ? ?REVIEW OF SYSTEMS:    ?GU Review Female:   Patient reports get up at night to urinate. Patient denies frequent urination, hard to postpone urination, burning /pain with urination, leakage of urine, stream starts and stops, trouble starting your stream, have to strain to urinate, and being pregnant.  ?Gastrointestinal (Upper):   Patient denies nausea, vomiting, and indigestion/ heartburn.  ?Gastrointestinal (Lower):   Patient reports constipation and diarrhea.   ?Constitutional:   Patient denies fever, night sweats, weight loss, and fatigue.  ?Skin:   Patient denies skin rash/ lesion and itching.  ?Eyes:   Patient denies blurred vision and double vision.  ?Ears/ Nose/ Throat:   Patient denies sore throat and sinus problems.  ?Hematologic/Lymphatic:   Patient denies swollen glands and easy bruising.  ?Cardiovascular:  Patient denies leg swelling and chest pains.  ?Respiratory:   Patient denies cough and shortness of breath.  ?Endocrine:   Patient denies excessive thirst.  ?Musculoskeletal:   Patient reports back pain and joint pain.   ?Neurological:   Patient denies headaches and dizziness.  ?Psychologic:   Patient denies depression and anxiety.  ? ?VITAL SIGNS:    ?  10/21/2021 10:45 AM  ?BP 166/77 mmHg  ?Pulse 76 /min  ?Temperature 98.4 F / 36.8 C  ? ?MULTI-SYSTEM PHYSICAL EXAMINATION:    ?Constitutional: Well-nourished. No physical deformities. Normally developed. Good grooming.  ?Neck: Neck symmetrical, not swollen. Normal tracheal position.  ?Respiratory: Normal breath sounds. No labored breathing, no use of accessory muscles.   ?Cardiovascular:  Regular rate and rhythm. No murmur, no gallop.   ?Skin: No paleness, no jaundice, no cyanosis. No lesion, no ulcer, no rash.  ?Neurologic / Psychiatric: Oriented to time, oriented to place, oriented to person. No depression, no a

## 2021-10-25 ENCOUNTER — Encounter (HOSPITAL_COMMUNITY)
Admission: RE | Disposition: A | Discharge status: Home / Self Care | Payer: Self-pay | Source: Ambulatory Visit | Attending: Urology

## 2021-10-25 ENCOUNTER — Ambulatory Visit (HOSPITAL_COMMUNITY)
Admission: RE | Admit: 2021-10-25 | Discharge: 2021-10-25 | Disposition: A | Discharge status: Home / Self Care | Payer: Medicare Other | Source: Ambulatory Visit | Attending: Urology | Admitting: Urology

## 2021-10-25 ENCOUNTER — Ambulatory Visit (HOSPITAL_COMMUNITY): Payer: Medicare Other | Admitting: Anesthesiology

## 2021-10-25 ENCOUNTER — Ambulatory Visit (HOSPITAL_COMMUNITY): Payer: Medicare Other

## 2021-10-25 ENCOUNTER — Encounter (HOSPITAL_COMMUNITY): Payer: Self-pay | Admitting: Urology

## 2021-10-25 ENCOUNTER — Ambulatory Visit (HOSPITAL_BASED_OUTPATIENT_CLINIC_OR_DEPARTMENT_OTHER): Payer: Medicare Other | Admitting: Anesthesiology

## 2021-10-25 DIAGNOSIS — Z87891 Personal history of nicotine dependence: Secondary | ICD-10-CM | POA: Diagnosis not present

## 2021-10-25 DIAGNOSIS — N202 Calculus of kidney with calculus of ureter: Secondary | ICD-10-CM | POA: Insufficient documentation

## 2021-10-25 DIAGNOSIS — Z87442 Personal history of urinary calculi: Secondary | ICD-10-CM | POA: Diagnosis present

## 2021-10-25 HISTORY — PX: CYSTOSCOPY/URETEROSCOPY/HOLMIUM LASER/STENT PLACEMENT: SHX6546

## 2021-10-25 SURGERY — CYSTOSCOPY/URETEROSCOPY/HOLMIUM LASER/STENT PLACEMENT
Anesthesia: General | Site: Urethra | Laterality: Left

## 2021-10-25 MED ORDER — FENTANYL CITRATE PF 50 MCG/ML IJ SOSY: 25.0000 ug | PREFILLED_SYRINGE | INTRAMUSCULAR | Status: DC | PRN

## 2021-10-25 MED ORDER — MORPHINE SULFATE (PF) 2 MG/ML IV SOLN: 2.0000 mg | INTRAVENOUS | Status: DC | PRN

## 2021-10-25 MED ORDER — OXYCODONE HCL 5 MG PO TABS: 5.0000 mg | ORAL_TABLET | ORAL | Status: DC | PRN

## 2021-10-25 MED ORDER — OXYCODONE-ACETAMINOPHEN 5-325 MG PO TABS
1.0000 | ORAL_TABLET | Freq: Four times a day (QID) | ORAL | 0 refills | Status: DC | PRN
Start: 2021-10-25 — End: 2021-11-12

## 2021-10-25 MED ORDER — LIDOCAINE 2% (20 MG/ML) 5 ML SYRINGE
INTRAMUSCULAR | Status: DC | PRN
  Administered 2021-10-25: 60 mg via INTRAVENOUS

## 2021-10-25 MED ORDER — OXYBUTYNIN CHLORIDE 5 MG PO TABS
5.0000 mg | ORAL_TABLET | Freq: Three times a day (TID) | ORAL | 1 refills | Status: DC | PRN
Start: 2021-10-25 — End: 2021-11-12

## 2021-10-25 MED ORDER — SODIUM CHLORIDE 0.9% FLUSH: 3.0000 mL | Freq: Two times a day (BID) | INTRAVENOUS | Status: DC

## 2021-10-25 MED ORDER — TAMSULOSIN HCL 0.4 MG PO CAPS: 0.4000 mg | ORAL_CAPSULE | Freq: Every day | ORAL | 1 refills | Status: DC

## 2021-10-25 MED ORDER — ONDANSETRON HCL 4 MG/2ML IJ SOLN
INTRAMUSCULAR | Status: AC
  Filled 2021-10-25: qty 2

## 2021-10-25 MED ORDER — CHLORHEXIDINE GLUCONATE 0.12 % MT SOLN
15.0000 mL | Freq: Once | OROMUCOSAL | Status: AC
  Administered 2021-10-25: 15 mL via OROMUCOSAL

## 2021-10-25 MED ORDER — SODIUM CHLORIDE 0.9% FLUSH: 3.0000 mL | INTRAVENOUS | Status: DC | PRN

## 2021-10-25 MED ORDER — ONDANSETRON HCL 4 MG/2ML IJ SOLN: 4.0000 mg | Freq: Once | INTRAMUSCULAR | Status: DC | PRN

## 2021-10-25 MED ORDER — DEXAMETHASONE SODIUM PHOSPHATE 10 MG/ML IJ SOLN
INTRAMUSCULAR | Status: DC | PRN
  Administered 2021-10-25: 5 mg via INTRAVENOUS

## 2021-10-25 MED ORDER — KETOROLAC TROMETHAMINE 30 MG/ML IJ SOLN
INTRAMUSCULAR | Status: AC
  Filled 2021-10-25: qty 1

## 2021-10-25 MED ORDER — LACTATED RINGERS IV SOLN: INTRAVENOUS | Status: DC

## 2021-10-25 MED ORDER — DEXAMETHASONE SODIUM PHOSPHATE 10 MG/ML IJ SOLN
INTRAMUSCULAR | Status: AC
  Filled 2021-10-25: qty 1

## 2021-10-25 MED ORDER — SODIUM CHLORIDE 0.9 % IR SOLN
Status: DC | PRN
  Administered 2021-10-25: 1000 mL
  Administered 2021-10-25: 1500 mL via INTRAVESICAL

## 2021-10-25 MED ORDER — FENTANYL CITRATE (PF) 100 MCG/2ML IJ SOLN
INTRAMUSCULAR | Status: AC
  Filled 2021-10-25: qty 2

## 2021-10-25 MED ORDER — ACETAMINOPHEN 10 MG/ML IV SOLN: 1000.0000 mg | Freq: Once | INTRAVENOUS | Status: DC | PRN

## 2021-10-25 MED ORDER — ONDANSETRON HCL 4 MG/2ML IJ SOLN
INTRAMUSCULAR | Status: DC | PRN
  Administered 2021-10-25: 4 mg via INTRAVENOUS

## 2021-10-25 MED ORDER — KETOROLAC TROMETHAMINE 30 MG/ML IJ SOLN
INTRAMUSCULAR | Status: DC | PRN
  Administered 2021-10-25: 30 mg via INTRAVENOUS

## 2021-10-25 MED ORDER — FENTANYL CITRATE (PF) 100 MCG/2ML IJ SOLN
INTRAMUSCULAR | Status: DC | PRN
Start: 2021-10-25 — End: 2021-10-25
  Administered 2021-10-25: 50 ug via INTRAVENOUS
  Administered 2021-10-25 (×2): 25 ug via INTRAVENOUS

## 2021-10-25 MED ORDER — BUPIVACAINE-EPINEPHRINE (PF) 0.25% -1:200000 IJ SOLN
INTRAMUSCULAR | Status: AC
  Filled 2021-10-25: qty 30

## 2021-10-25 MED ORDER — ACETAMINOPHEN 325 MG PO TABS: 650.0000 mg | ORAL_TABLET | ORAL | Status: DC | PRN

## 2021-10-25 MED ORDER — LIDOCAINE HCL (PF) 2 % IJ SOLN
INTRAMUSCULAR | Status: AC
  Filled 2021-10-25: qty 5

## 2021-10-25 MED ORDER — ORAL CARE MOUTH RINSE: 15.0000 mL | Freq: Once | OROMUCOSAL | Status: AC

## 2021-10-25 MED ORDER — OXYCODONE HCL 5 MG/5ML PO SOLN: 5.0000 mg | Freq: Once | ORAL | Status: AC | PRN

## 2021-10-25 MED ORDER — PROPOFOL 10 MG/ML IV BOLUS
INTRAVENOUS | Status: DC | PRN
  Administered 2021-10-25: 150 mg via INTRAVENOUS

## 2021-10-25 MED ORDER — OXYCODONE HCL 5 MG PO TABS
5.0000 mg | ORAL_TABLET | Freq: Once | ORAL | Status: AC | PRN
  Administered 2021-10-25: 5 mg via ORAL

## 2021-10-25 MED ORDER — OXYCODONE HCL 5 MG PO TABS
ORAL_TABLET | ORAL | Status: AC
  Filled 2021-10-25: qty 1

## 2021-10-25 MED ORDER — CEFAZOLIN SODIUM-DEXTROSE 2-4 GM/100ML-% IV SOLN
2.0000 g | INTRAVENOUS | Status: AC
  Administered 2021-10-25: 2 g via INTRAVENOUS
  Filled 2021-10-25: qty 100

## 2021-10-25 MED ORDER — SODIUM CHLORIDE 0.9 % IV SOLN: 250.0000 mL | INTRAVENOUS | Status: DC | PRN

## 2021-10-25 MED ORDER — ACETAMINOPHEN 650 MG RE SUPP
650.0000 mg | RECTAL | Status: DC | PRN
  Filled 2021-10-25: qty 1

## 2021-10-25 MED ORDER — PHENAZOPYRIDINE HCL 200 MG PO TABS: 200.0000 mg | ORAL_TABLET | Freq: Three times a day (TID) | ORAL | 0 refills | Status: DC | PRN

## 2021-10-25 MED ORDER — IOHEXOL 300 MG/ML  SOLN
INTRAMUSCULAR | Status: DC | PRN
  Administered 2021-10-25: 10 mL via URETHRAL

## 2021-10-25 SURGICAL SUPPLY — 24 items
BAG URO CATCHER STRL LF (MISCELLANEOUS) ×2 IMPLANT
BASKET STONE NCOMPASS (UROLOGICAL SUPPLIES) IMPLANT
CATH URETERAL DUAL LUMEN 10F (MISCELLANEOUS) IMPLANT
CATH URETL OPEN 5X70 (CATHETERS) IMPLANT
CLOTH BEACON ORANGE TIMEOUT ST (SAFETY) ×2 IMPLANT
DRSG TEGADERM 2-3/8X2-3/4 SM (GAUZE/BANDAGES/DRESSINGS) ×1 IMPLANT
EXTRACTOR STONE NITINOL NGAGE (UROLOGICAL SUPPLIES) ×1 IMPLANT
GLOVE SURG SS PI 8.0 STRL IVOR (GLOVE) ×4 IMPLANT
GOWN STRL REUS W/ TWL XL LVL3 (GOWN DISPOSABLE) ×1 IMPLANT
GOWN STRL REUS W/TWL XL LVL3 (GOWN DISPOSABLE) ×6
GUIDEWIRE STR DUAL SENSOR (WIRE) ×2 IMPLANT
IV NS IRRIG 3000ML ARTHROMATIC (IV SOLUTION) ×1 IMPLANT
KIT TURNOVER KIT A (KITS) ×1 IMPLANT
LASER FIB FLEXIVA PULSE ID 365 (Laser) IMPLANT
LASER FIB FLEXIVA PULSE ID 550 (Laser) IMPLANT
LASER FIB FLEXIVA PULSE ID 910 (Laser) IMPLANT
MANIFOLD NEPTUNE II (INSTRUMENTS) ×2 IMPLANT
PACK CYSTO (CUSTOM PROCEDURE TRAY) ×2 IMPLANT
SHEATH NAVIGATOR HD 11/13X36 (SHEATH) ×1 IMPLANT
STENT URET 6FRX22 CONTOUR (STENTS) ×1 IMPLANT
TRACTIP FLEXIVA PULS ID 200XHI (Laser) IMPLANT
TRACTIP FLEXIVA PULSE ID 200 (Laser) ×2
TUBING CONNECTING 10 (TUBING) ×2 IMPLANT
TUBING UROLOGY SET (TUBING) ×2 IMPLANT

## 2021-10-25 NOTE — Transfer of Care (Signed)
Immediate Anesthesia Transfer of Care Note ? ?Patient: CYTHIA BACHTEL ? ?Procedure(s) Performed: CYSTOSCOPY LEFT URETEROSCOPY/HOLMIUM LASER/STENT PLACEMENT (Left: Urethra) ? ?Patient Location: PACU ? ?Anesthesia Type:General ? ?Level of Consciousness: awake, sedated and responds to stimulation ? ?Airway & Oxygen Therapy: Patient Spontanous Breathing and Patient connected to face mask oxygen ? ?Post-op Assessment: Report given to RN and Post -op Vital signs reviewed and stable ? ?Post vital signs: Reviewed and stable and BP 164/79. ? ?Last Vitals:  ?Vitals Value Taken Time  ?BP    ?Temp    ?Pulse 88 10/25/21 1251  ?Resp    ?SpO2 100 % 10/25/21 1251  ? ? ?Last Pain:  ?Vitals:  ? 10/25/21 0941  ?TempSrc:   ?PainSc: 0-No pain  ?   ? ?Patients Stated Pain Goal: 4 (10/25/21 0941) ? ?Complications: No notable events documented. ?

## 2021-10-25 NOTE — Anesthesia Preprocedure Evaluation (Signed)
Anesthesia Evaluation  ?Patient identified by MRN, date of birth, ID band ?Patient awake ? ? ? ?Reviewed: ?Allergy & Precautions, NPO status , Patient's Chart, lab work & pertinent test results ? ?Airway ?Mallampati: II ? ?TM Distance: >3 FB ?Neck ROM: Full ? ? ? Dental ?no notable dental hx. ? ?  ?Pulmonary ?neg pulmonary ROS, former smoker,  ?  ?Pulmonary exam normal ?breath sounds clear to auscultation ? ? ? ? ? ? Cardiovascular ?hypertension, Normal cardiovascular exam ?Rhythm:Regular Rate:Normal ? ? ?  ?Neuro/Psych ?negative neurological ROS ? negative psych ROS  ? GI/Hepatic ?negative GI ROS, Neg liver ROS,   ?Endo/Other  ?negative endocrine ROS ? Renal/GU ?negative Renal ROS  ?negative genitourinary ?  ?Musculoskeletal ?negative musculoskeletal ROS ?(+)  ? Abdominal ?  ?Peds ?negative pediatric ROS ?(+)  Hematology ?negative hematology ROS ?(+)   ?Anesthesia Other Findings ? ? Reproductive/Obstetrics ?negative OB ROS ? ?  ? ? ? ? ? ? ? ? ? ? ? ? ? ?  ?  ? ? ? ? ? ? ? ? ?Anesthesia Physical ?Anesthesia Plan ? ?ASA: 2 ? ?Anesthesia Plan: General  ? ?Post-op Pain Management: Minimal or no pain anticipated  ? ?Induction: Intravenous ? ?PONV Risk Score and Plan: Ondansetron, Dexamethasone and Treatment may vary due to age or medical condition ? ?Airway Management Planned: LMA ? ?Additional Equipment:  ? ?Intra-op Plan:  ? ?Post-operative Plan: Extubation in OR ? ?Informed Consent: I have reviewed the patients History and Physical, chart, labs and discussed the procedure including the risks, benefits and alternatives for the proposed anesthesia with the patient or authorized representative who has indicated his/her understanding and acceptance.  ? ? ? ?Dental advisory given ? ?Plan Discussed with: CRNA and Surgeon ? ?Anesthesia Plan Comments:   ? ? ? ? ? ? ?Anesthesia Quick Evaluation ? ?

## 2021-10-25 NOTE — Interval H&P Note (Signed)
History and Physical Interval Note:  She has a dull ache in the left flank.  KUB today shows one possible stone in the area of the left proximal ureter at about L3-4.  The remainder of the stone is in the LLP.  ? ?10/25/2021 ?11:10 AM ? ?Jodi Dawson  has presented today for surgery, with the diagnosis of LEFT URETERAL AND RENAL STONES.  The various methods of treatment have been discussed with the patient and family. After consideration of risks, benefits and other options for treatment, the patient has consented to  Procedure(s): ?CYSTOSCOPY LEFT URETEROSCOPY/HOLMIUM LASER/STENT PLACEMENT (Left) as a surgical intervention.  The patient's history has been reviewed, patient examined, no change in status, stable for surgery.  I have reviewed the patient's chart and labs.  Questions were answered to the patient's satisfaction.   ? ? ?Irine Seal ? ? ?

## 2021-10-25 NOTE — Anesthesia Procedure Notes (Signed)
Procedure Name: LMA Insertion ?Date/Time: 10/25/2021 11:22 AM ?Performed by: Lollie Sails, CRNA ?Pre-anesthesia Checklist: Patient identified, Emergency Drugs available, Suction available, Patient being monitored and Timeout performed ?Patient Re-evaluated:Patient Re-evaluated prior to induction ?Oxygen Delivery Method: Circle system utilized ?Preoxygenation: Pre-oxygenation with 100% oxygen ?Induction Type: IV induction ?Ventilation: Mask ventilation without difficulty ?LMA: LMA inserted ?LMA Size: 4.0 ?Number of attempts: 1 ?Placement Confirmation: positive ETCO2 and breath sounds checked- equal and bilateral ?Tube secured with: Tape ?Dental Injury: Teeth and Oropharynx as per pre-operative assessment  ? ? ? ? ?

## 2021-10-25 NOTE — Anesthesia Postprocedure Evaluation (Signed)
Anesthesia Post Note ? ?Patient: Jodi Dawson ? ?Procedure(s) Performed: CYSTOSCOPY LEFT URETEROSCOPY/HOLMIUM LASER/STENT PLACEMENT (Left: Urethra) ? ?  ? ?Patient location during evaluation: PACU ?Anesthesia Type: General ?Level of consciousness: awake and alert and oriented ?Pain management: pain level controlled ?Vital Signs Assessment: post-procedure vital signs reviewed and stable ?Respiratory status: spontaneous breathing, nonlabored ventilation and respiratory function stable ?Cardiovascular status: blood pressure returned to baseline and stable ?Postop Assessment: no apparent nausea or vomiting ?Anesthetic complications: no ? ? ?No notable events documented. ? ?Last Vitals:  ?Vitals:  ? 10/25/21 1251 10/25/21 1300  ?BP: (!) 164/79 (!) 146/77  ?Pulse: 88 83  ?Resp: 13 15  ?Temp: 37.2 ?C   ?SpO2: 100% 97%  ?  ?Last Pain:  ?Vitals:  ? 10/25/21 1251  ?TempSrc:   ?PainSc: 0-No pain  ? ? ?  ?  ?  ?  ?  ?  ? ?Yariana Hoaglund A. ? ? ? ? ?

## 2021-10-25 NOTE — Op Note (Signed)
Procedure: 1.  Cystoscopy with left retrograde pyelogram and interpretation. ?2.  Left ureteroscopy with holmium laser application, stone extraction and insertion of left double-J stent. ?3.  Application of fluoroscopy. ? ?Preop diagnosis: Left proximal ureteral and renal stones. ? ?Postop diagnosis: Same. ? ?Surgeon: Dr. Irine Seal. ? ?Anesthesia: General. ? ?Specimen: Stone fragments. ? ?Drains: 6 French by 22 cm left contour double-J stent with tether. ? ?EBL: None. ? ?Complications: None. ? ?Indications: Jodi Dawson is a 71 year old female who was originally seen with a large left UPJ stone that was treated with lithotripsy.  The stone fragmented but there were large residual fragments and she had minimal stone passage.  She elected to undergo ureteroscopy for definitive therapy. ? ?Procedure: She was taken operating room where she was given 2 g of Ancef.  A general anesthetic was induced.  She was placed in lithotomy position and fitted with PAS hose.  Her perineum and genitalia were prepped with Betadine solution and she was draped in usual sterile fashion. ? ?Cystoscopy was performed using a 21 [place] scope with a 30 degree lens.  Examination revealed a normal urethra.  The bladder wall was smooth and pale without tumors, stones or inflammation.  Ureteral orifices were unremarkable. ? ?The left ureteral orifice was cannulated with a 5 [place] open-ended catheter and contrast was instilled. ? ?The left retrograde pyelogram revealed a normal caliber ureter with a filling defect at the level of L3-4 consistent with a stone fragment and some proximal dilation with filling defects in the lower pole consistent with residual stone fragments. ? ?A sensor wire was then advanced to the kidney under fluoroscopic guidance and the open-ended catheter was removed.  The inner core of a 36 cm 11/13 digital access sheath was passed over the wire and easily advanced to the level of the stone in the proximal ureter.  The dilator was  removed and the 6.5 French semirigid ureteroscope was advanced alongside the wire to the level of the stone which was visualized with the stone flush back to the kidney. ? ?The access sheath inner core was then advanced over the wire to the kidney without difficulty.  The sheath was then assembled and inserted to the level of the UPJ and the inner core and wire were removed. ? ?The dual-lumen digital ureteroscope was then passed into the renal collecting system and ureteral stone fragment was now in the renal pelvis.  The stone was engaged with a tract tip 200 ?m laser fiber with the Moses laser set on the dusting/lithotripsy setting.  The stone was then treated with the dusting setting and broken into manageable fragments which were then removed with an engage basket. ? ?I then identified the remaining fragments in the lower calyx and treated them with the laser with primarily the dusting setting but occasionally with the lithotripsy setting until they were broken into fragments that could be removed with the engage basket.  Multiple passes were made to remove all significant stone fragments with additional lasering performed as needed.  Once all of the significant fragments were felt to have been removed, final inspection with ureteroscope demonstrated only dust and grit remaining primarily in the lower and mid calyces.  Fluoroscopy was also clear. ? ?A sensor wire was then passed through the sheath to the kidney and the sheath was removed.  The cystoscope was reinserted over the wire and a 6 [place] by 22 cm contour double-J stent was advanced the kidney under fluoroscopic guidance.  The wire was removed,  leaving a good coil in the kidney and a good coil in the bladder.  The cystoscope was removed leaving the stent string exiting the urethra.  The string was tied close to the meatus and trimmed to an appropriate length before was talked vaginally.  Final fluoroscopic and cystoscopic inspection revealed excellent  stent placement.  The stone fragments were collected and given to the patient's family after the procedure.  She was taken down from lithotomy position, her anesthetic was reversed and she was moved recovery in stable condition.  There were no complications. ?

## 2021-10-25 NOTE — Discharge Instructions (Addendum)
You may remove the stent by pulling the attached string on Tuesday morning.  If you don't feel you can do that, please call the office to arrange to have it removed.  ?

## 2021-10-26 ENCOUNTER — Encounter (HOSPITAL_COMMUNITY): Payer: Self-pay | Admitting: Urology

## 2021-11-08 ENCOUNTER — Encounter: Payer: Self-pay | Admitting: *Deleted

## 2021-11-12 ENCOUNTER — Ambulatory Visit: Payer: Medicare Other | Admitting: Internal Medicine

## 2021-11-12 ENCOUNTER — Encounter: Payer: Self-pay | Admitting: Internal Medicine

## 2021-11-12 VITALS — BP 172/84 | HR 78 | Ht 63.5 in | Wt 143.1 lb

## 2021-11-12 DIAGNOSIS — R140 Abdominal distension (gaseous): Secondary | ICD-10-CM

## 2021-11-12 DIAGNOSIS — R198 Other specified symptoms and signs involving the digestive system and abdomen: Secondary | ICD-10-CM | POA: Diagnosis not present

## 2021-11-12 DIAGNOSIS — C801 Malignant (primary) neoplasm, unspecified: Secondary | ICD-10-CM | POA: Insufficient documentation

## 2021-11-12 NOTE — Patient Instructions (Signed)
Please purchase the following medications over the counter and take as directed: ?Fiber supplement (Benefiber or Metamucil) as directed once daily. ? ?We have given you a FODMAP diet to look over and follow. ? ?We will request your records from Dr Collene Mares. ? ?If you are age 72 or older, your body mass index should be between 23-30. Your Body mass index is 24.96 kg/m?Marland Kitchen If this is out of the aforementioned range listed, please consider follow up with your Primary Care Provider. ? ?If you are age 75 or younger, your body mass index should be between 19-25. Your Body mass index is 24.96 kg/m?Marland Kitchen If this is out of the aformentioned range listed, please consider follow up with your Primary Care Provider.  ? ?________________________________________________________ ? ?The [facility] providers would like to encourage you to use [facility] to communicate with providers for non-urgent requests or questions.  Due to long hold times on the telephone, sending your provider a message by [facility] may be a faster and more efficient way to get a response.  Please allow 48 business hours for a response.  Please remember that this is for non-urgent requests.  ?_______________________________________________________ ? ?Due to recent changes in healthcare laws, you may see the results of your imaging and laboratory studies on MyChart before your provider has had a chance to review them.  We understand that in some cases there may be results that are confusing or concerning to you. Not all laboratory results come back in the same time frame and the provider may be waiting for multiple results in order to interpret others.  Please give [place] 48 hours in order for your provider to thoroughly review all the results before contacting the office for clarification of your results.  ? ?

## 2021-11-12 NOTE — Progress Notes (Addendum)
Chief Complaint: Changes in bowel habits  HPI : 71 year old female with history of osteoporosis presents with changes in bowel habits  She tends to have one BM every day. During month of the March, she would have 2-3 days where she couldn't have a BM, then should would have alternating with diarrhea 4-5 times per day. She has felt bloated. In the middle of March, she had kidney stone issues, and thus she underwent a lithotripsy procedure 2.5 weeks ago. She went onto a bland diet, which has helped somewhat. Denies hematochezia or melena. Denies abdominal pain, N&V, dysphagia, acid reflux, weight loss. Denies fam hx of GI issues. Last colonoscopy was in 2014-2015 and was told to come back in 10 years.    Past Medical History:  Diagnosis Date   Anxiety    Ascorbic acid deficiency disease    Atherosclerosis of coronary artery    Cancer ([place])    History of kidney stones    Hyperlipidemia    Hypertension    Insomnia    Osteoporosis    Vitamin D deficiency      Past Surgical History:  Procedure Laterality Date   BACK SURGERY     COLONOSCOPY     CYSTOSCOPY/URETEROSCOPY/HOLMIUM LASER/STENT PLACEMENT Left 10/25/2021   Procedure: CYSTOSCOPY LEFT URETEROSCOPY/HOLMIUM LASER/STENT PLACEMENT;  Surgeon: Irine Seal, MD;  Location: [facility];  Service: Urology;  Laterality: Left;   EXTRACORPOREAL SHOCK WAVE LITHOTRIPSY Left 10/10/2021   Procedure: LEFT EXTRACORPOREAL SHOCK WAVE LITHOTRIPSY (ESWL);  Surgeon: Bjorn Loser, MD;  Location: [facility];  Service: Urology;  Laterality: Left;   EYE SURGERY     LITHOTRIPSY     PELVIC LAPAROSCOPY     left ovary removal   WISDOM TOOTH EXTRACTION     Family History  Problem Relation Age of Onset   Hypertension Mother    Alzheimer's disease Mother    Hypertension Father    Heart attack Father    Heart disease Brother    Diabetes Brother    Cancer Brother        lung   Stroke Brother    Breast cancer Maternal Aunt    Diabetes  Maternal Aunt    Hypertension Maternal Aunt    Cancer Maternal Grandmother        lung   Cancer Paternal Grandfather        adrenal   Colon cancer Neg Hx    Esophageal cancer Neg Hx    Colon polyps Neg Hx    Pancreatic cancer Neg Hx    Stomach cancer Neg Hx    Social History   Tobacco Use   Smoking status: Former    Types: Cigarettes    Quit date: 10/05/1982    Years since quitting: 39.1   Smokeless tobacco: Never  Vaping Use   Vaping Use: Never used  Substance Use Topics   Alcohol use: Yes    Alcohol/week: 2.0 standard drinks    Types: 2 Glasses of wine per week    Comment: 2 glasses of wine per week   Drug use: Never   Current Outpatient Medications  Medication Sig Dispense Refill   cholecalciferol (VITAMIN D3) 25 MCG (1000 UNIT) tablet Take 1,000 Units by mouth daily.     polyvinyl alcohol (LIQUIFILM TEARS) 1.4 % ophthalmic solution Place 1 drop into both eyes as needed for dry eyes.     simvastatin (ZOCOR) 10 MG tablet Take 10 mg by mouth daily.     telmisartan (MICARDIS)  40 MG tablet Take 40 mg by mouth daily.     No current facility-administered medications for this visit.   Allergies  Allergen Reactions   Crestor [Rosuvastatin] Itching    myalgias   Novocain [Procaine] Palpitations     Review of Systems: All systems reviewed and negative except where noted in HPI.   Physical Exam: BP (!) 172/84   Pulse 78   Ht 5' 3.5" (1.613 m)   Wt 143 lb 2 oz (64.9 kg)   SpO2 98%   BMI 24.96 kg/m  Constitutional: Pleasant,well-developed, female in no acute distress. HEENT: Normocephalic and atraumatic. Conjunctivae are normal. No scleral icterus. Cardiovascular: Normal rate, regular rhythm.  Pulmonary/chest: Effort normal and breath sounds normal. No wheezing, rales or rhonchi. Abdominal: Soft, nondistended, nontender. Bowel sounds active throughout. There are no masses palpable. No hepatomegaly. Extremities: No edema Neurological: Alert and oriented to person  place and time. Skin: Skin is warm and dry. No rashes noted. Psychiatric: Normal mood and affect. Behavior is normal.  Labs 10/2021: CBC and BMP unremarkable  ASSESSMENT AND PLAN: Alternating constipation and diarrhea  Bloating Patient presents with alternating bowel habits, which may be related to IBS. Could also consider contribution from constipation and overflow diarrhea. Will institute a low FODMAP diet and start a daily fiber supplement to start off with. Patient may benefit from a colonoscopy for further evaluation, but will plan to get records of her prior colonoscopy with Dr. Collene Mares first - [place] handout - Start daily fiber supplement - Obtain records of last colonoscopy was Dr. Collene Mares, expected to be in 2014-2015  Christia Reading, MD  ADDENDUM: Received records from the patient's last colonoscopy on 03/31/14, done by Dr. Collene Mares. The colonoscopy showed small internal hemorrhoids but was otherwise unremarkable. Quality of bowel prep was good. I called the patient to check in. She states that her symptoms have completely resolved. She is taking a daily Metamucil now. Okay to hold off on colonoscopy for now, and will plan for next colonoscopy for colon cancer screening in due in 03/2024. Patient will let me know if she has any changes in the interim.

## 2021-11-16 ENCOUNTER — Other Ambulatory Visit: Payer: Self-pay | Admitting: Urology

## 2021-12-18 ENCOUNTER — Telehealth: Payer: Self-pay

## 2021-12-18 NOTE — Telephone Encounter (Signed)
-----   Message from Sharyn Creamer, MD sent at 12/18/2021 12:11 PM EDT ----- Hi Ammie, please place recall for colonoscopy for colon cancer screening for 03/2024. Thanks.

## 2021-12-18 NOTE — Telephone Encounter (Signed)
Recall placed

## 2023-12-18 ENCOUNTER — Other Ambulatory Visit: Payer: Self-pay

## 2023-12-18 ENCOUNTER — Emergency Department (HOSPITAL_COMMUNITY)
Admission: EM | Admit: 2023-12-18 | Discharge: 2023-12-18 | Disposition: A | Discharge status: Home / Self Care | Attending: Emergency Medicine | Admitting: Emergency Medicine

## 2023-12-18 ENCOUNTER — Encounter (HOSPITAL_COMMUNITY): Payer: Self-pay

## 2023-12-18 ENCOUNTER — Emergency Department (HOSPITAL_COMMUNITY)

## 2023-12-18 DIAGNOSIS — R42 Dizziness and giddiness: Secondary | ICD-10-CM | POA: Diagnosis present

## 2023-12-18 DIAGNOSIS — R55 Syncope and collapse: Secondary | ICD-10-CM | POA: Insufficient documentation

## 2023-12-18 LAB — BASIC METABOLIC PANEL WITH GFR
Anion gap: 8 (ref 5–15)
BUN: 23 mg/dL (ref 8–23)
CO2: 25 mmol/L (ref 22–32)
Calcium: 9.5 mg/dL (ref 8.9–10.3)
Chloride: 105 mmol/L (ref 98–111)
Creatinine, Ser: 0.77 mg/dL (ref 0.44–1.00)
GFR, Estimated: >60 mL/min (ref >60)
Glucose, Bld: 99 mg/dL (ref 70–99)
Potassium: 3.9 mmol/L (ref 3.5–5.1)
Sodium: 138 mmol/L (ref 135–145)

## 2023-12-18 LAB — CBC
HCT: 41.7 % (ref 36.0–46.0)
Hemoglobin: 13.6 g/dL (ref 12.0–15.0)
MCH: 29.3 pg (ref 26.0–34.0)
MCHC: 32.6 g/dL (ref 30.0–36.0)
MCV: 89.9 fL (ref 80.0–100.0)
Platelets: 165 10*3/uL (ref 150–400)
RBC: 4.64 MIL/uL (ref 3.87–5.11)
RDW: 12.3 % (ref 11.5–15.5)
WBC: 7.4 10*3/uL (ref 4.0–10.5)
nRBC: 0 % (ref 0.0–0.2)

## 2023-12-18 LAB — TROPONIN I (HIGH SENSITIVITY): Troponin I (High Sensitivity): 7 ng/L (ref <18)

## 2023-12-18 NOTE — Discharge Instructions (Addendum)
 Return for any problem.  ?

## 2023-12-18 NOTE — ED Provider Notes (Signed)
 [place] EMERGENCY DEPARTMENT AT [facility] Provider Note   CSN: [id number] Arrival date & time: 12/18/23  1433     History  Chief Complaint  Patient presents with   Dizziness    Jodi Dawson is a 73 y.o. female.  73 year old female with prior medical history as detailed below presents for evaluation.  Patient reports intermittent episodes over the last 2 to 3 weeks of acute onset lightheadedness, dizziness, flushing.  She reports that her face feels "hot".  She denies any actual episodes of syncope.  She denies associated chest pain, shortness of breath, headache, visual change.  She denies any focal weakness.  She reports that her episodes last approximately 10 minutes to 45 minutes.  She denies any symptoms at present.  She reports significant stress over the last several weeks secondary to health issues with her husband.  She reports compliance with previously prescribed medications including her antihypertensive.  She admits that she has whitecoat hypertension.  Her blood pressure during her episodes has been fairly normal with a systolic in the 150s and diastolic in the 90s.  The history is provided by the patient.       Home Medications Prior to Admission medications   Medication Sig Start Date End Date Taking? Authorizing Provider  cholecalciferol (VITAMIN D3) 25 MCG (1000 UNIT) tablet Take 1,000 Units by mouth daily.    [provider]  polyvinyl alcohol (LIQUIFILM TEARS) 1.4 % ophthalmic solution Place 1 drop into both eyes as needed for dry eyes.    [provider]  simvastatin (ZOCOR) 10 MG tablet Take 10 mg by mouth daily.    [provider]  telmisartan (MICARDIS) 40 MG tablet Take 40 mg by mouth daily.    [provider]      Allergies    Crestor [rosuvastatin] and Novocain [procaine]    Review of Systems   Review of Systems  All other systems reviewed and are negative.   Physical Exam Updated Vital Signs BP  (!) 180/84   Pulse 86   Temp 98.3 F (36.8 C) (Oral)   Resp 18   Ht 5\' 3"  (1.6 m)   Wt 65.8 kg   SpO2 100%   BMI 25.69 kg/m  Physical Exam Vitals and nursing note reviewed.  Constitutional:      General: She is not in acute distress.    Appearance: Normal appearance. She is well-developed.  HENT:     Head: Normocephalic and atraumatic.  Eyes:     Conjunctiva/sclera: Conjunctivae normal.     Pupils: Pupils are equal, round, and reactive to light.  Cardiovascular:     Rate and Rhythm: Normal rate and regular rhythm.     Heart sounds: Normal heart sounds.  Pulmonary:     Effort: Pulmonary effort is normal. No respiratory distress.     Breath sounds: Normal breath sounds.  Abdominal:     General: There is no distension.     Palpations: Abdomen is soft.     Tenderness: There is no abdominal tenderness.  Musculoskeletal:        General: No deformity. Normal range of motion.     Cervical back: Normal range of motion and neck supple.  Skin:    General: Skin is warm and dry.  Neurological:     General: No focal deficit present.     Mental Status: She is alert and oriented to person, place, and time.     ED Results / Procedures / Treatments  Labs (all labs ordered are listed, but only abnormal results are displayed) Labs Reviewed  BASIC METABOLIC PANEL WITH GFR  CBC  TROPONIN I (HIGH SENSITIVITY)    EKG EKG Interpretation Date/Time:  Friday December 18 2023 16:13:22 EDT Ventricular Rate:  86 PR Interval:  146 QRS Duration:  94 QT Interval:  353 QTC Calculation: 423 R Axis:   45  Text Interpretation: Sinus rhythm Probable left atrial enlargement No significant change since last tracing Confirmed by Trish Furl [phone]) on 12/18/2023 4:15:29 PM  Radiology DG Chest Port 1 View Result Date: 12/18/2023 CLINICAL DATA:  604540 Pain 144615. EXAM: PORTABLE CHEST 1 VIEW COMPARISON:  None Available. FINDINGS: Bilateral lung fields are clear. Bilateral costophrenic angles are clear.  Normal cardio-mediastinal silhouette. No acute osseous abnormalities. The soft tissues are within normal limits. IMPRESSION: No active disease. Electronically Signed   By: Beula Brunswick M.D.   On: 12/18/2023 17:10    Procedures Procedures    Medications Ordered in ED Medications - No data to display  ED Course/ Medical Decision Making/ A&P                                 Medical Decision Making Amount and/or Complexity of Data Reviewed Labs: ordered.    Medical Screen Complete  This patient presented to the ED with complaint of intermittent flushing episodes, dizziness, weakness.  This complaint involves an extensive number of treatment options. The initial differential diagnosis includes, but is not limited to, metabolic abnormality, arrhythmia, carcinoid syndrome, etc.  This presentation is: Acute, Chronic, Self-Limited, Previously Undiagnosed, Uncertain Prognosis, Complicated, Systemic Symptoms, and Threat to Life/Bodily Function  Patient is presenting with intermittent episodes of flushing, weakness, dizziness.  Patient is asymptomatic at time of evaluation.    Patient without findings concerning for significant acute pathology on exam.  Screening labs obtained are well within normal limits without significant abnormality.  Patient observed.  No evidence of arrhythmia seen on monitoring.  EKG obtained is without acute ischemia.  Troponin is without significant elevation.  Patient is reassured after workup.  She desires discharge home.  She does understand need for close outpatient follow-up with her PCP.  She is aware that given her reported symptoms additional workup is required in the outpatient setting.  Strict return precautions given understood.  Importance of close follow-up stressed.  Additional history obtained:  External records from outside sources obtained and reviewed including prior ED visits and prior Inpatient records.    Problem List / ED  Course:  Transient near syncope episode   Reevaluation:  After the interventions noted above, I reevaluated the patient and found that they have: resolved  Disposition:  After consideration of the diagnostic results and the patients response to treatment, I feel that the patent would benefit from close outpatient follow-up.          Final Clinical Impression(s) / ED Diagnoses Final diagnoses:  Near syncope    Rx / DC Orders ED Discharge Orders     None         Burnette Carte, MD 12/18/23 [phone]

## 2023-12-18 NOTE — ED Triage Notes (Addendum)
 Patient said she keeps getting hot flashes, then she gets light headed and dizzy. It goes away and comes back. This has been going on for 2 weeks. Has upper back pain. Increased stress in the household. Has taken her BP medication. Denies change in vision.

## 2024-03-07 ENCOUNTER — Ambulatory Visit
Admission: RE | Admit: 2024-03-07 | Discharge: 2024-03-07 | Disposition: A | Discharge status: Home / Self Care | Source: Ambulatory Visit | Attending: Family Medicine | Admitting: Family Medicine

## 2024-03-07 VITALS — BP 183/91 | HR 84 | Temp 98.7°F | Resp 16

## 2024-03-07 DIAGNOSIS — S66912A Strain of unspecified muscle, fascia and tendon at wrist and hand level, left hand, initial encounter: Secondary | ICD-10-CM | POA: Diagnosis not present

## 2024-03-07 MED ORDER — DICLOFENAC SODIUM 75 MG PO TBEC: 75.0000 mg | DELAYED_RELEASE_TABLET | Freq: Two times a day (BID) | ORAL | 0 refills | Status: AC

## 2024-03-07 NOTE — ED Provider Notes (Signed)
 [facility]  Note:  This document was prepared using Conservation officer, historic buildings and may include unintentional dictation errors.  MRN: [id number] DOB: July 17, 1950  Subjective:   Jodi Dawson is a 73 y.o. female presenting for 4-day history of left wrist pain.  Symptoms started while she was doing her physical therapy, strengthening for her osteoporosis.  Had to use her wrists and hands to manage and support her weight through her exercise.  No fall, trauma, swelling, bruising, fever, warmth, redness.  Has been told by her PCP to avoid NSAIDs.  No history of CKD.  No history of heart disease, MI.  No current facility-administered medications for this encounter.  Current Outpatient Medications:    ezetimibe (ZETIA) 10 MG tablet, 1 tablet Orally Once a day; Duration: 100 days, Disp: , Rfl:    cholecalciferol (VITAMIN D3) 25 MCG (1000 UNIT) tablet, Take 1,000 Units by mouth daily., Disp: , Rfl:    polyvinyl alcohol (LIQUIFILM TEARS) 1.4 % ophthalmic solution, Place 1 drop into both eyes as needed for dry eyes., Disp: , Rfl:    RESTASIS 0.05 % ophthalmic emulsion, 1 drop 2 (two) times daily., Disp: , Rfl:    simvastatin (ZOCOR) 10 MG tablet, Take 10 mg by mouth daily., Disp: , Rfl:    telmisartan (MICARDIS) 40 MG tablet, Take 40 mg by mouth daily., Disp: , Rfl:    Allergies  Allergen Reactions   Ceftriaxone     Shaking, dizziness  Other Reaction(s): shaking and dizziness   Crestor [Rosuvastatin] Itching    myalgias   Doxycycline Diarrhea and Nausea And Vomiting    Other Reaction(s): n/v/diarrhea   Sulfa Antibiotics     Dizziness, felt sick  Other Reaction(s): dizzy, felt sick   Novocain [Procaine] Palpitations    Past Medical History:  Diagnosis Date   Anxiety    Ascorbic acid deficiency disease    Atherosclerosis of coronary artery    Cancer ([facility])    History of kidney stones    Hyperlipidemia    Hypertension    Insomnia    Osteoporosis     Vitamin D deficiency      Past Surgical History:  Procedure Laterality Date   BACK SURGERY     COLONOSCOPY     CYSTOSCOPY/URETEROSCOPY/HOLMIUM LASER/STENT PLACEMENT Left 10/25/2021   Procedure: CYSTOSCOPY LEFT URETEROSCOPY/HOLMIUM LASER/STENT PLACEMENT;  Surgeon: Watt Rush, MD;  Location: [facility];  Service: Urology;  Laterality: Left;   EXTRACORPOREAL SHOCK WAVE LITHOTRIPSY Left 10/10/2021   Procedure: LEFT EXTRACORPOREAL SHOCK WAVE LITHOTRIPSY (ESWL);  Surgeon: Gaston Hamilton, MD;  Location: [facility];  Service: Urology;  Laterality: Left;   EYE SURGERY     LITHOTRIPSY     PELVIC LAPAROSCOPY     left ovary removal   WISDOM TOOTH EXTRACTION      Family History  Problem Relation Age of Onset   Hypertension Mother    Alzheimer's disease Mother    Hypertension Father    Heart attack Father    Heart disease Brother    Diabetes Brother    Cancer Brother        lung   Stroke Brother    Breast cancer Maternal Aunt    Diabetes Maternal Aunt    Hypertension Maternal Aunt    Cancer Maternal Grandmother        lung   Cancer Paternal Grandfather        adrenal   Colon cancer Neg Hx    Esophageal cancer  Neg Hx    Colon polyps Neg Hx    Pancreatic cancer Neg Hx    Stomach cancer Neg Hx     Social History   Tobacco Use   Smoking status: Former    Current packs/day: 0.00    Types: Cigarettes    Quit date: 10/05/1982    Years since quitting: 41.4   Smokeless tobacco: Never  Vaping Use   Vaping status: Never Used  Substance Use Topics   Alcohol use: Yes    Alcohol/week: 2.0 standard drinks of alcohol    Types: 2 Glasses of wine per week    Comment: 2 glasses of wine per week   Drug use: Never    ROS   Objective:   Vitals: BP (!) 183/91 (BP Location: Left Arm) Comment: Pt said he bloos pressure was normal ta home, she has white coat syndorme  Pulse 84   Temp 98.7 F (37.1 C) (Oral)   Resp 16   SpO2 97%   Physical Exam Constitutional:       General: She is not in acute distress.    Appearance: Normal appearance. She is well-developed. She is not ill-appearing, toxic-appearing or diaphoretic.  HENT:     Head: Normocephalic and atraumatic.     Nose: Nose normal.     Mouth/Throat:     Mouth: Mucous membranes are moist.  Eyes:     General: No scleral icterus.       Right eye: No discharge.        Left eye: No discharge.     Extraocular Movements: Extraocular movements intact.  Cardiovascular:     Rate and Rhythm: Normal rate.  Pulmonary:     Effort: Pulmonary effort is normal.  Musculoskeletal:     Left wrist: Tenderness and bony tenderness (along area outlined) present. No swelling, deformity, effusion, lacerations, snuff box tenderness or crepitus. Normal range of motion.       Arms:  Skin:    General: Skin is warm and dry.  Neurological:     General: No focal deficit present.     Mental Status: She is alert and oriented to person, place, and time.  Psychiatric:        Mood and Affect: Mood normal.        Behavior: Behavior normal.     Assessment and Plan :   PDMP not reviewed this encounter.  1. Strain of left wrist, initial encounter    Offered imaging but patient declined.  Her primary concern was to have an evaluation prior to her travel tomorrow.  Suspect a left wrist strain from her exercise.  Recommended conservative management including RICE method, diclofenac  for her pain and inflammation.  Counseled patient on potential for adverse effects with medications prescribed/recommended today, ER and return-to-clinic precautions discussed, patient verbalized understanding.    Christopher Savannah, [place] 03/08/24 [phone]

## 2024-03-07 NOTE — ED Triage Notes (Signed)
 Pt reports pain in left wrist x 4 days. Pt think she has sprain or tendonitis in left wrist. Pt think she injured when she was doing Osteo strong exercises. Wrist brace and ibuprofen gives some relief. Reports her PCP do not like her to take ibuprofen.

## 2024-03-07 NOTE — Discharge Instructions (Signed)
 Use Voltaren  orally for pain and inflammation. Use the wrist brace during the day. If you continue to have symptoms, follow up with an orthopedist group.

## 2024-08-02 ENCOUNTER — Encounter: Payer: Self-pay | Admitting: Internal Medicine

## 2024-08-09 ENCOUNTER — Ambulatory Visit: Admitting: Physical Therapy

## 2024-08-09 ENCOUNTER — Encounter: Payer: Self-pay | Admitting: Physical Therapy

## 2024-08-09 DIAGNOSIS — M5459 Other low back pain: Secondary | ICD-10-CM

## 2024-08-09 NOTE — Therapy (Signed)
 " OUTPATIENT PHYSICAL THERAPY THORACOLUMBAR EVALUATION   Patient Name: Jodi Dawson MRN: [id number] DOB:[birth year], 74 y.o., female Today's Date: 08/09/2024  END OF SESSION:  PT End of Session - 08/09/24 1353     Visit Number 1    Number of Visits 12    Date for Recertification  10/04/24    Authorization Type [employer] MCR    PT Start Time 1345    PT Stop Time 1430    PT Time Calculation (min) 45 min    Activity Tolerance Patient tolerated treatment well    Behavior During Therapy WFL for tasks assessed/performed          Past Medical History:  Diagnosis Date   Anxiety    Ascorbic acid deficiency disease    Atherosclerosis of coronary artery    Cancer (HCC)    History of kidney stones    Hyperlipidemia    Hypertension    Insomnia    Osteoporosis    Vitamin D deficiency    Past Surgical History:  Procedure Laterality Date   BACK SURGERY     COLONOSCOPY     CYSTOSCOPY/URETEROSCOPY/HOLMIUM LASER/STENT PLACEMENT Left 10/25/2021   Procedure: CYSTOSCOPY LEFT URETEROSCOPY/HOLMIUM LASER/STENT PLACEMENT;  Surgeon: Watt Rush, MD;  Location: [facility];  Service: Urology;  Laterality: Left;   EXTRACORPOREAL SHOCK WAVE LITHOTRIPSY Left 10/10/2021   Procedure: LEFT EXTRACORPOREAL SHOCK WAVE LITHOTRIPSY (ESWL);  Surgeon: Gaston Hamilton, MD;  Location[facility];  Service: Urology;  Laterality: Left;   EYE SURGERY     LITHOTRIPSY     PELVIC LAPAROSCOPY     left ovary removal   WISDOM TOOTH EXTRACTION     Patient Active Problem List   Diagnosis Date Noted   Cancer (HCC) 11/12/2021    PCP: Shayne Anes, MD  REFERRING PROVIDER: Shayne Anes, MD  REFERRING DIAG: M54.50 (ICD-10-CM) - Low back pain, unspecified  Rationale for Evaluation and Treatment: Rehabilitation  THERAPY DIAG:  Other low back pain  ONSET DATE: chronic  SUBJECTIVE:                                                                                                                                                                                            SUBJECTIVE STATEMENT: Patient presents to physical therapy evaluation for complaint of chronic low back pain.  She was referred to this clinic by 2 of her friends who participate in Pilates and would like to see if this can help her chronic pain.  Her doctor was supportive of this choice  I have pain everyday. If I get up it is stiff.  By the time I'm  up it is better she reports the pain is always into 2/10 -3/10.  The pain/stiffness in her low back limit her ability to walk as long as she would like to get both her fitness and for recreation, shopping.  Her back feels very stiff if she sits for period of time.  She has been doing [employer] for 6 yrs.   and this has helped her maintain her bone density.    She also does UE weights at the gym 2 days per week.    PERTINENT HISTORY:  Wrist tendonitis  Back surgery  Osteoporosis per DEXA   PAIN:  Are you having pain? Yes: NPRS scale: 2/10-3/10 Pain location: low back and to the Rt.  Pain description: stiff, achy  Aggravating factors: walk> 1 mile Relieving factors: stretching, heat or ice  (rounding)   PRECAUTIONS: Other: osteoporosis  RED FLAGS: None   WEIGHT BEARING RESTRICTIONS: No  FALLS:  Has patient fallen in last 6 months? No  LIVING ENVIRONMENT: Lives with: lives with their spouse Lives in: House/apartment Stairs: No Has following equipment at home: None  OCCUPATION: Not working, Veterinary Surgeon   PLOF: Independent, Leisure: TV, reading, and time with husband, volunteer    PATIENT GOALS: I want to see if Pilates can help my back.   NEXT MD VISIT: none   OBJECTIVE:  Note: Objective measures were completed at Evaluation unless otherwise noted.  DIAGNOSTIC FINDINGS:  IMPRESSION: 1. Status post RIGHT L5-S1 hemilaminectomy. 2. Degenerative change of the lumbar spine. 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 3. Minimal to mild  neural foraminal narrowing L2-3 through L5-S1.  PATIENT SURVEYS:  Oswestry Low Back Pain Disability Questionnaire:  Total 15/50= 30%   Interpretation of scores: Score Category Description  0-20% Minimal Disability The patient can cope with most living activities. Usually no treatment is indicated apart from advice on lifting, sitting and exercise  21-40% Moderate Disability The patient experiences more pain and difficulty with sitting, lifting and standing. Travel and social life are more difficult and they may be disabled from work. Personal care, sexual activity and sleeping are not grossly affected, and the patient can usually be managed by conservative means  41-60% Severe Disability Pain remains the main problem in this group, but activities of daily living are affected. These patients require a detailed investigation  61-80% Crippled Back pain impinges on all aspects of the patients life. Positive intervention is required  81-100% Bed-bound These patients are either bed-bound or exaggerating their symptoms  Reference: Adelle SPEAK, Pynsent PB. The Oswestry Disability Index. Spine 2000 Nov 15;25(22):2940-52; discussion 73.  Minimum detectable change (90% confidence): 10% points   COGNITION: Overall cognitive status: Within functional limits for tasks assessed     SENSATION: WFL  MUSCLE LENGTH: Hamstrings:normal Thomas test:normal   POSTURE: rounded shoulders, forward head, and increased thoracic kyphosis  PALPATION: Palpation to the lumbar spine revealed hypertonic paraspinals on the right side of her spine compared to the left pain with palpation to the L3-L4 region bilaterally but more pronounced on the right side.  No pain in the gluteals  LUMBAR ROM:   AROM eval  Flexion WFL with mid range pain  Extension Tight, limited 25%  Right rot. Limited 50%  Left rot.  Limited 30%   Right lat flex 3-4 fingers above knee   Left lat flex  3-4 fingers above knee    (Blank rows =  not tested)  LOWER EXTREMITY ROM:   WNL   Active  Right eval Left eval  Hip flexion    Hip extension    Hip abduction    Hip adduction    Hip internal rotation    Hip external rotation    Knee flexion    Knee extension    Ankle dorsiflexion    Ankle plantarflexion    Ankle inversion    Ankle eversion     (Blank rows = not tested)  LOWER EXTREMITY MMT:    MMT Right eval Left eval  Hip flexion 4+ 4+  Hip extension 3+ 3+  Hip abduction 4- 4-  Hip adduction    Hip internal rotation    Hip external rotation    Knee flexion 5 5  Knee extension 5 5  Ankle dorsiflexion    Ankle plantarflexion    Ankle inversion    Ankle eversion     (Blank rows = not tested)  LUMBAR SPECIAL TESTS:  Neg SLR   FUNCTIONAL TESTS:  5 times sit to stand: 10.8 sec  SLS 25 sec each LE   GAIT: No deviations noted with ambulation in clinic  TREATMENT DATE:  PT eval  [facility] Adult PT Treatment:                                                DATE: 08/09/24 T evaluation complete Trial repetitions performed of all home exercises prescribed Plan of care PT Pilates studio Benefits of Pilates and core stability Osteostrong effectiveness                                                                                                                       PATIENT EDUCATION:  Education details: see above  Person educated: Patient Education method: Programmer, Multimedia, Demonstration, and Handouts Education comprehension: verbalized understanding, verbal cues required, and needs further education  HOME EXERCISE PROGRAM: Access Code: [email or web address] URL: [email or web address] Date: 08/09/2024 Prepared by: Delon Norma  Exercises - Supine Posterior Pelvic Tilt  - 1 x daily - 7 x weekly - 2 sets - 10 reps - 5 hold - Pilates Bridge  - 1 x daily - 7 x weekly - 2 sets - 10 reps - 5 hold - Supine Lower Trunk Rotation  - 1 x daily - 7 x weekly - 2 sets - 10 reps - 10 hold - Sidelying Hip Abduction   - 1 x daily - 7 x weekly - 2 sets - 10 reps - 5 hold - Hooklying Single Knee to Chest Stretch  - 1 x daily - 7 x weekly - 1 sets - 3 reps - 30 hold  ASSESSMENT:  CLINICAL IMPRESSION: Patient is a 74 y.o. female who was seen today for physical therapy evaluation and treatment for back pain which is chronic. She is intetrested in using the PIlates equipment to see if that cna be a long term soluation to building core  stabiltiy and improving posture.   OBJECTIVE IMPAIRMENTS: decreased mobility, difficulty walking, decreased ROM, decreased strength, hypomobility, increased fascial restrictions, improper body mechanics, postural dysfunction, and pain.   ACTIVITY LIMITATIONS: carrying, lifting, bending, sitting, standing, squatting, sleeping, transfers, and locomotion level  PARTICIPATION LIMITATIONS: cleaning, interpersonal relationship, shopping, and community activity  PERSONAL FACTORS: Time since onset of injury/illness/exacerbation and 1 comorbidity: osteoporosis/osteopenia are also affecting patient's functional outcome.   REHAB POTENTIAL: Excellent  CLINICAL DECISION MAKING: Stable/uncomplicated  EVALUATION COMPLEXITY: Low   GOALS: Goals reviewed with patient? Yes   LONG TERM GOALS: Target date: 6 weeks POC, 8 week cert. 10/04/2024    Patient will be independent with final HEP upon discharge from PT and report consistent benefit following exercise completion.    Baseline:  Goal status: INITIAL  2.  Patient will be able to demonstrate hip/knee/ankle strength to 5/5 in order to maximize functional mobility, ambulation and lifting.   Baseline:  Goal status: INITIAL  3.  Patient will be able to demonstrate proper posture and lifting techniques related to spine health and reduction of symptoms.   Baseline:  Goal status: INITIAL  4.  Pt will be able to use lower body resistance exercises at the gym to work on building bone density without increased back pain.  Baseline:  Goal  status: INITIAL  5.  ODI score will improve by 10%  Baseline: 30% Goal status: INITIAL  6.  Patient will be able to walk 1 mile with low back pain < 4/10 Baseline: can be 5/10-6/10  Goal status: INITIAL  PLAN:  PT FREQUENCY: 1-2x/week  PT DURATION: 8 weeks  PLANNED INTERVENTIONS: 97164- PT Re-evaluation, 97750- Physical Performance Testing, 97110-Therapeutic exercises, 97530- Therapeutic activity, 97112- Neuromuscular re-education, 97535- Self Care, [id number]- Manual therapy, Patient/Family education, Balance training, Stair training, Spinal mobilization, Cryotherapy, and Moist heat.  PLAN FOR NEXT SESSION: check HEP, core in all positions, Pilates Tower    Lonnie Rosado, PT 08/09/2024, 7:28 PM    Date of referral: 08/02/24 Referring provider: Dr. Oneil Neth Referring diagnosis? M54.50 (ICD-10-CM) - Low back pain, unspecified Treatment diagnosis? (if different than referring diagnosis) low back pain, chronic   What was this (referring dx) caused by? Ongoing Issue  Lysle of Condition: Chronic (continuous duration > 3 months)   Laterality: Rt  Current Functional Measure Score: Other M-ODI  Objective measurements identify impairments when they are compared to normal values, the uninvolved extremity, and prior level of function.  [x]  Yes  []  No  Objective assessment of functional ability: Moderate functional limitations   Briefly describe symptoms: Low back pain, spine stiffness  How did symptoms start: Patient has had back pain for > 30 years.  She had lumbar hemilaminectomy and pain never completely resolved.   Average pain intensity:  Last 24 hours: 2  Past week: 4  How often does the pt experience symptoms? Frequently  How much have the symptoms interfered with usual daily activities? Moderately  How has condition changed since care began at this facility? NA - initial visit  In general, how is the patients overall health? Very Good   BACK PAIN (STarT Back  Screening Tool) No  "

## 2024-08-16 NOTE — Therapy (Unsigned)
 " OUTPATIENT PHYSICAL THERAPY THORACOLUMBAR EVALUATION   Patient Name: Jodi Dawson MRN: [id number] DOB:26-Dec-1950, 74 y.o., female Today's Date: 08/16/2024  END OF SESSION:    Past Medical History:  Diagnosis Date   Anxiety    Ascorbic acid deficiency disease    Atherosclerosis of coronary artery    Cancer (HCC)    History of kidney stones    Hyperlipidemia    Hypertension    Insomnia    Osteoporosis    Vitamin D deficiency    Past Surgical History:  Procedure Laterality Date   BACK SURGERY     COLONOSCOPY     CYSTOSCOPY/URETEROSCOPY/HOLMIUM LASER/STENT PLACEMENT Left 10/25/2021   Procedure: CYSTOSCOPY LEFT URETEROSCOPY/HOLMIUM LASER/STENT PLACEMENT;  Surgeon: Watt Rush, MD;  Location: [facility];  Service: Urology;  Laterality: Left;   EXTRACORPOREAL SHOCK WAVE LITHOTRIPSY Left 10/10/2021   Procedure: LEFT EXTRACORPOREAL SHOCK WAVE LITHOTRIPSY (ESWL);  Surgeon: Gaston Hamilton, MD;  Location: [facility];  Service: Urology;  Laterality: Left;   EYE SURGERY     LITHOTRIPSY     PELVIC LAPAROSCOPY     left ovary removal   WISDOM TOOTH EXTRACTION     Patient Active Problem List   Diagnosis Date Noted   Cancer (HCC) 11/12/2021    PCP: Shayne Anes, MD  REFERRING PROVIDER: Shayne Anes, MD  REFERRING DIAG: M54.50 (ICD-10-CM) - Low back pain, unspecified  Rationale for Evaluation and Treatment: Rehabilitation  THERAPY DIAG:  No diagnosis found.  ONSET DATE: chronic  SUBJECTIVE:                                                                                                                                                                                           SUBJECTIVE STATEMENT: Patient presents to physical therapy evaluation for complaint of chronic low back pain.  She was referred to this clinic by 2 of her friends who participate in Pilates and would like to see if this can help her chronic pain.  Her doctor was supportive of this choice  I  have pain everyday. If I get up it is stiff.  By the time I'm up it is better she reports the pain is always into 2/10 -3/10.  The pain/stiffness in her low back limit her ability to walk as long as she would like to get both her fitness and for recreation, shopping.  Her back feels very stiff if she sits for period of time.  She has been doing [employer] for 6 yrs.   and this has helped her maintain her bone density.    She also does UE weights at the gym 2  days per week.    PERTINENT HISTORY:  Wrist tendonitis  Back surgery  Osteoporosis per DEXA   PAIN:  Are you having pain? Yes: NPRS scale: 2/10-3/10 Pain location: low back and to the Rt.  Pain description: stiff, achy  Aggravating factors: walk> 1 mile Relieving factors: stretching, heat or ice  (rounding)   PRECAUTIONS: Other: osteoporosis  RED FLAGS: None   WEIGHT BEARING RESTRICTIONS: No  FALLS:  Has patient fallen in last 6 months? No  LIVING ENVIRONMENT: Lives with: lives with their spouse Lives in: House/apartment Stairs: No Has following equipment at home: None  OCCUPATION: Not working, Veterinary Surgeon   PLOF: Independent, Leisure: TV, reading, and time with husband, volunteer    PATIENT GOALS: I want to see if Pilates can help my back.   NEXT MD VISIT: none   OBJECTIVE:  Note: Objective measures were completed at Evaluation unless otherwise noted.  DIAGNOSTIC FINDINGS:  IMPRESSION: 1. Status post RIGHT L5-S1 hemilaminectomy. 2. Degenerative change of the lumbar spine. 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 3. Minimal to mild neural foraminal narrowing L2-3 through L5-S1.  PATIENT SURVEYS:  Oswestry Low Back Pain Disability Questionnaire:  Total 15/50= 30%   Interpretation of scores: Score Category Description  0-20% Minimal Disability The patient can cope with most living activities. Usually no treatment is indicated apart from advice on lifting, sitting and exercise   21-40% Moderate Disability The patient experiences more pain and difficulty with sitting, lifting and standing. Travel and social life are more difficult and they may be disabled from work. Personal care, sexual activity and sleeping are not grossly affected, and the patient can usually be managed by conservative means  41-60% Severe Disability Pain remains the main problem in this group, but activities of daily living are affected. These patients require a detailed investigation  61-80% Crippled Back pain impinges on all aspects of the patients life. Positive intervention is required  81-100% Bed-bound These patients are either bed-bound or exaggerating their symptoms  Reference: Adelle SPEAK, Pynsent PB. The Oswestry Disability Index. Spine 2000 Nov 15;25(22):2940-52; discussion 28.  Minimum detectable change (90% confidence): 10% points   COGNITION: Overall cognitive status: Within functional limits for tasks assessed     SENSATION: WFL  MUSCLE LENGTH: Hamstrings:normal Thomas test:normal   POSTURE: rounded shoulders, forward head, and increased thoracic kyphosis  PALPATION: Palpation to the lumbar spine revealed hypertonic paraspinals on the right side of her spine compared to the left pain with palpation to the L3-L4 region bilaterally but more pronounced on the right side.  No pain in the gluteals  LUMBAR ROM:   AROM eval  Flexion WFL with mid range pain  Extension Tight, limited 25%  Right rot. Limited 50%  Left rot.  Limited 30%   Right lat flex 3-4 fingers above knee   Left lat flex  3-4 fingers above knee    (Blank rows = not tested)  LOWER EXTREMITY ROM:   WNL   Active  Right eval Left eval  Hip flexion    Hip extension    Hip abduction    Hip adduction    Hip internal rotation    Hip external rotation    Knee flexion    Knee extension    Ankle dorsiflexion    Ankle plantarflexion    Ankle inversion    Ankle eversion     (Blank rows = not  tested)  LOWER EXTREMITY MMT:    MMT Right eval Left eval  Hip flexion 4+ 4+  Hip extension 3+ 3+  Hip abduction 4- 4-  Hip adduction    Hip internal rotation    Hip external rotation    Knee flexion 5 5  Knee extension 5 5  Ankle dorsiflexion    Ankle plantarflexion    Ankle inversion    Ankle eversion     (Blank rows = not tested)  LUMBAR SPECIAL TESTS:  Neg SLR   FUNCTIONAL TESTS:  5 times sit to stand: 10.8 sec  SLS 25 sec each LE   GAIT: No deviations noted with ambulation in clinic  TREATMENT DATE:   [facility] Adult PT Treatment:                                                DATE: 08/16/24 Therapeutic Exercise: *** Manual Therapy: *** Neuromuscular re-ed: *** Therapeutic Activity: *** Modalities: *** Self Care: ***   PT eval  [facility] Adult PT Treatment:                                                DATE: 08/09/24 T evaluation complete Trial repetitions performed of all home exercises prescribed Plan of care PT Pilates studio Benefits of Pilates and core stability Osteostrong effectiveness                                                                                                                       PATIENT EDUCATION:  Education details: see above  Person educated: Patient Education method: Explanation, Demonstration, and Handouts Education comprehension: verbalized understanding, verbal cues required, and needs further education  HOME EXERCISE PROGRAM: Access Code: [email or web address] URL: [email or web address] Date: 08/09/2024 Prepared by: Delon Norma  Exercises - Supine Posterior Pelvic Tilt  - 1 x daily - 7 x weekly - 2 sets - 10 reps - 5 hold - Pilates Bridge  - 1 x daily - 7 x weekly - 2 sets - 10 reps - 5 hold - Supine Lower Trunk Rotation  - 1 x daily - 7 x weekly - 2 sets - 10 reps - 10 hold - Sidelying Hip Abduction  - 1 x daily - 7 x weekly - 2 sets - 10 reps - 5 hold - Hooklying Single Knee to Chest Stretch  - 1 x daily - 7  x weekly - 1 sets - 3 reps - 30 hold  ASSESSMENT:  CLINICAL IMPRESSION: Patient is a 74 y.o. female who was seen today for physical therapy evaluation and treatment for back pain which is chronic. She is intetrested in using the PIlates equipment to see if that cna be a long term soluation to building core stabiltiy and improving posture.   OBJECTIVE IMPAIRMENTS: decreased mobility, difficulty walking, decreased  ROM, decreased strength, hypomobility, increased fascial restrictions, improper body mechanics, postural dysfunction, and pain.   ACTIVITY LIMITATIONS: carrying, lifting, bending, sitting, standing, squatting, sleeping, transfers, and locomotion level  PARTICIPATION LIMITATIONS: cleaning, interpersonal relationship, shopping, and community activity  PERSONAL FACTORS: Time since onset of injury/illness/exacerbation and 1 comorbidity: osteoporosis/osteopenia are also affecting patient's functional outcome.   REHAB POTENTIAL: Excellent  CLINICAL DECISION MAKING: Stable/uncomplicated  EVALUATION COMPLEXITY: Low   GOALS: Goals reviewed with patient? Yes   LONG TERM GOALS: Target date: 6 weeks POC, 8 week cert. 10/04/2024    Patient will be independent with final HEP upon discharge from PT and report consistent benefit following exercise completion.    Baseline:  Goal status: INITIAL  2.  Patient will be able to demonstrate hip/knee/ankle strength to 5/5 in order to maximize functional mobility, ambulation and lifting.   Baseline:  Goal status: INITIAL  3.  Patient will be able to demonstrate proper posture and lifting techniques related to spine health and reduction of symptoms.   Baseline:  Goal status: INITIAL  4.  Pt will be able to use lower body resistance exercises at the gym to work on building bone density without increased back pain.  Baseline:  Goal status: INITIAL  5.  ODI score will improve by 10%  Baseline: 30% Goal status: INITIAL  6.  Patient will be  able to walk 1 mile with low back pain < 4/10 Baseline: can be 5/10-6/10  Goal status: INITIAL  PLAN:  PT FREQUENCY: 1-2x/week  PT DURATION: 8 weeks  PLANNED INTERVENTIONS: 97164- PT Re-evaluation, 97750- Physical Performance Testing, 97110-Therapeutic exercises, 97530- Therapeutic activity, 97112- Neuromuscular re-education, 97535- Self Care, 02859- Manual therapy, Patient/Family education, Balance training, Stair training, Spinal mobilization, Cryotherapy, and Moist heat.  PLAN FOR NEXT SESSION: check HEP, core in all positions, Pilates Tower    Suprina Mandeville, PT 08/16/2024, 3:20 PM    Date of referral: 08/02/24 Referring provider: Dr. Oneil Neth Referring diagnosis? M54.50 (ICD-10-CM) - Low back pain, unspecified Treatment diagnosis? (if different than referring diagnosis) low back pain, chronic   What was this (referring dx) caused by? Ongoing Issue  Lysle of Condition: Chronic (continuous duration > 3 months)   Laterality: Rt  Current Functional Measure Score: Other M-ODI  Objective measurements identify impairments when they are compared to normal values, the uninvolved extremity, and prior level of function.  [x]  Yes  []  No  Objective assessment of functional ability: Moderate functional limitations   Briefly describe symptoms: Low back pain, spine stiffness  How did symptoms start: Patient has had back pain for > 30 years.  She had lumbar hemilaminectomy and pain never completely resolved.   Average pain intensity:  Last 24 hours: 2  Past week: 4  How often does the pt experience symptoms? Frequently  How much have the symptoms interfered with usual daily activities? Moderately  How has condition changed since care began at this facility? NA - initial visit  In general, how is the patients overall health? Very Good   BACK PAIN (STarT Back Screening Tool) No  "

## 2024-08-17 ENCOUNTER — Ambulatory Visit: Admitting: Physical Therapy

## 2024-08-17 ENCOUNTER — Encounter: Payer: Self-pay | Admitting: Physical Therapy

## 2024-08-17 DIAGNOSIS — M5459 Other low back pain: Secondary | ICD-10-CM | POA: Diagnosis not present

## 2024-08-19 ENCOUNTER — Ambulatory Visit: Admitting: Physical Therapy

## 2024-08-25 ENCOUNTER — Encounter: Admitting: Physical Therapy

## 2024-08-30 ENCOUNTER — Encounter: Admitting: Physical Therapy

## 2024-08-31 ENCOUNTER — Ambulatory Visit: Admitting: Internal Medicine

## 2024-09-06 ENCOUNTER — Encounter: Admitting: Physical Therapy

## 2024-09-08 ENCOUNTER — Encounter: Admitting: Physical Therapy
